# Patient Record
Sex: Female | Born: 1975 | Race: Black or African American | Hispanic: No | Marital: Single | Smoking: Current every day smoker
Health system: Southern US, Community
[De-identification: ages and names within clinical notes are randomized; demographics above are authoritative.]

## PROBLEM LIST (undated history)

## (undated) DIAGNOSIS — F329 Major depressive disorder, single episode, unspecified: Secondary | ICD-10-CM

## (undated) DIAGNOSIS — I10 Essential (primary) hypertension: Secondary | ICD-10-CM

## (undated) DIAGNOSIS — E119 Type 2 diabetes mellitus without complications: Secondary | ICD-10-CM

## (undated) DIAGNOSIS — R87629 Unspecified abnormal cytological findings in specimens from vagina: Secondary | ICD-10-CM

## (undated) DIAGNOSIS — J45909 Unspecified asthma, uncomplicated: Secondary | ICD-10-CM

## (undated) DIAGNOSIS — F32A Depression, unspecified: Secondary | ICD-10-CM

## (undated) DIAGNOSIS — M1990 Unspecified osteoarthritis, unspecified site: Secondary | ICD-10-CM

## (undated) HISTORY — PX: TUBAL LIGATION: SHX77

## (undated) HISTORY — DX: Unspecified asthma, uncomplicated: J45.909

## (undated) HISTORY — DX: Unspecified abnormal cytological findings in specimens from vagina: R87.629

## (undated) HISTORY — DX: Unspecified osteoarthritis, unspecified site: M19.90

---

## 2009-07-14 ENCOUNTER — Emergency Department (HOSPITAL_COMMUNITY): Admission: EM | Admit: 2009-07-14 | Discharge: 2009-07-14 | Payer: Self-pay | Admitting: Emergency Medicine

## 2009-08-15 ENCOUNTER — Emergency Department (HOSPITAL_COMMUNITY): Admission: EM | Admit: 2009-08-15 | Discharge: 2009-08-15 | Payer: Self-pay | Admitting: Emergency Medicine

## 2010-06-23 LAB — GC/CHLAMYDIA PROBE AMP, GENITAL
Chlamydia, DNA Probe: POSITIVE — AB
GC Probe Amp, Genital: NEGATIVE

## 2010-06-23 LAB — URINALYSIS, ROUTINE W REFLEX MICROSCOPIC
Glucose, UA: NEGATIVE mg/dL
Protein, ur: NEGATIVE mg/dL
Urobilinogen, UA: 0.2 mg/dL (ref 0.0–1.0)

## 2010-06-23 LAB — CBC
HCT: 38 % (ref 36.0–46.0)
MCHC: 33.3 g/dL (ref 30.0–36.0)
MCV: 93.5 fL (ref 78.0–100.0)
Platelets: 274 10*3/uL (ref 150–400)
RDW: 13.2 % (ref 11.5–15.5)

## 2010-06-23 LAB — WET PREP, GENITAL

## 2010-06-23 LAB — COMPREHENSIVE METABOLIC PANEL
Albumin: 3.7 g/dL (ref 3.5–5.2)
BUN: 7 mg/dL (ref 6–23)
Calcium: 9.2 mg/dL (ref 8.4–10.5)
Chloride: 109 mEq/L (ref 96–112)
Creatinine, Ser: 0.81 mg/dL (ref 0.4–1.2)
Total Bilirubin: 0.5 mg/dL (ref 0.3–1.2)
Total Protein: 7.3 g/dL (ref 6.0–8.3)

## 2010-06-23 LAB — DIFFERENTIAL
Basophils Absolute: 0 10*3/uL (ref 0.0–0.1)
Lymphocytes Relative: 23 % (ref 12–46)
Monocytes Absolute: 0.5 10*3/uL (ref 0.1–1.0)
Neutro Abs: 5.8 10*3/uL (ref 1.7–7.7)

## 2010-06-23 LAB — LIPASE, BLOOD: Lipase: 15 U/L (ref 11–59)

## 2010-06-23 LAB — URINE MICROSCOPIC-ADD ON

## 2010-06-24 LAB — COMPREHENSIVE METABOLIC PANEL
ALT: 49 U/L — ABNORMAL HIGH (ref 0–35)
Albumin: 3.9 g/dL (ref 3.5–5.2)
Alkaline Phosphatase: 104 U/L (ref 39–117)
BUN: 8 mg/dL (ref 6–23)
Potassium: 4 mEq/L (ref 3.5–5.1)
Sodium: 138 mEq/L (ref 135–145)
Total Protein: 7.6 g/dL (ref 6.0–8.3)

## 2010-06-24 LAB — POCT PREGNANCY, URINE: Preg Test, Ur: NEGATIVE

## 2010-06-24 LAB — DIFFERENTIAL
Basophils Relative: 1 % (ref 0–1)
Lymphs Abs: 4.2 10*3/uL — ABNORMAL HIGH (ref 0.7–4.0)
Monocytes Absolute: 0.6 10*3/uL (ref 0.1–1.0)
Monocytes Relative: 7 % (ref 3–12)
Neutro Abs: 4.5 10*3/uL (ref 1.7–7.7)

## 2010-06-24 LAB — POCT URINALYSIS DIP (DEVICE)
Protein, ur: NEGATIVE mg/dL
Urobilinogen, UA: 0.2 mg/dL (ref 0.0–1.0)

## 2010-06-24 LAB — CBC
Platelets: 278 10*3/uL (ref 150–400)
RDW: 13.7 % (ref 11.5–15.5)

## 2010-09-25 ENCOUNTER — Emergency Department (HOSPITAL_COMMUNITY)
Admission: EM | Admit: 2010-09-25 | Discharge: 2010-09-25 | Disposition: A | Discharge status: Home / Self Care | Payer: Medicaid Other | Attending: Emergency Medicine | Admitting: Emergency Medicine

## 2010-09-25 DIAGNOSIS — M79609 Pain in unspecified limb: Secondary | ICD-10-CM | POA: Insufficient documentation

## 2010-09-25 DIAGNOSIS — F329 Major depressive disorder, single episode, unspecified: Secondary | ICD-10-CM | POA: Insufficient documentation

## 2010-09-25 DIAGNOSIS — K1370 Unspecified lesions of oral mucosa: Secondary | ICD-10-CM | POA: Insufficient documentation

## 2010-09-25 DIAGNOSIS — F3289 Other specified depressive episodes: Secondary | ICD-10-CM | POA: Insufficient documentation

## 2010-09-25 DIAGNOSIS — E669 Obesity, unspecified: Secondary | ICD-10-CM | POA: Insufficient documentation

## 2010-09-25 DIAGNOSIS — L989 Disorder of the skin and subcutaneous tissue, unspecified: Secondary | ICD-10-CM | POA: Insufficient documentation

## 2010-09-25 DIAGNOSIS — L909 Atrophic disorder of skin, unspecified: Secondary | ICD-10-CM | POA: Insufficient documentation

## 2011-09-01 ENCOUNTER — Encounter (HOSPITAL_COMMUNITY): Payer: Self-pay | Admitting: *Deleted

## 2011-09-01 ENCOUNTER — Emergency Department (HOSPITAL_COMMUNITY): Payer: Medicaid Other

## 2011-09-01 ENCOUNTER — Emergency Department (HOSPITAL_COMMUNITY)
Admission: EM | Admit: 2011-09-01 | Discharge: 2011-09-01 | Disposition: A | Discharge status: Home / Self Care | Payer: Medicaid Other | Attending: Emergency Medicine | Admitting: Emergency Medicine

## 2011-09-01 DIAGNOSIS — M25519 Pain in unspecified shoulder: Secondary | ICD-10-CM | POA: Insufficient documentation

## 2011-09-01 DIAGNOSIS — M25511 Pain in right shoulder: Secondary | ICD-10-CM

## 2011-09-01 DIAGNOSIS — R0989 Other specified symptoms and signs involving the circulatory and respiratory systems: Secondary | ICD-10-CM | POA: Insufficient documentation

## 2011-09-01 DIAGNOSIS — R0609 Other forms of dyspnea: Secondary | ICD-10-CM | POA: Insufficient documentation

## 2011-09-01 MED ORDER — TRAMADOL HCL 50 MG PO TABS: 50.0000 mg | ORAL_TABLET | Freq: Four times a day (QID) | ORAL | Status: AC | PRN

## 2011-09-01 MED ORDER — OXYCODONE-ACETAMINOPHEN 5-325 MG PO TABS
1.0000 | ORAL_TABLET | Freq: Once | ORAL | Status: AC
  Administered 2011-09-01: 1 via ORAL
  Filled 2011-09-01: qty 1

## 2011-09-01 MED ORDER — ONDANSETRON 4 MG PO TBDP
4.0000 mg | ORAL_TABLET | Freq: Once | ORAL | Status: AC
  Administered 2011-09-01: 4 mg via ORAL
  Filled 2011-09-01: qty 1

## 2011-09-01 MED ORDER — NAPROXEN 375 MG PO TABS: 375.0000 mg | ORAL_TABLET | Freq: Two times a day (BID) | ORAL | Status: DC

## 2011-09-01 NOTE — ED Notes (Signed)
Discharged home with written and verbal instructions.  Left ambulatory steady gait.  3 bus tickets given for family to return home.

## 2011-09-01 NOTE — Discharge Instructions (Signed)
As we discussed, your shoulder x-ray was normal today. Your chest x-ray showed some congestion but no specific emergency cause for your symptoms. You should call Dr Janey Greaser office tomorrow for a follow-up appointment for further evaluation.     Dyspnea Shortness of breath (dyspnea) is the feeling of uneasy breathing. Dyspnea should be evaluated promptly. DIAGNOSIS  Many tests may be done to find why you are having shortness of breath. Tests may include:  A chest X-ray.   A lung function test.   Blood tests.   Recordings of the electrical activity of the heart (electrocardiogram).   Exercise testing.   Sound wave images of the heart (a cardiac echocardiogram).   A scan.  A cause for your shortness of breath may not be identified initially. In this case, it is important to have a follow-up exam with your caregiver. HOME CARE INSTRUCTIONS   Do not smoke. Smoking is a common cause of shortness of breath. Ask for help to stop smoking.   Avoid being around chemicals that may bother your breathing, such as paint fumes or dust.   Rest as needed. Slowly begin your usual activities.   If medications were prescribed, take them as directed for the full length of time directed. This includes oxygen and any inhaled medications, if prescribed.   It is very important that you follow up with your caregiver or other physician as directed. Waiting to do so or failure to follow up could result in worsening of your condition, possible disability, or death.   Be sure you understand what to do or who to call if your shortness of breath worsens.  SEEK MEDICAL CARE IF:   Your condition does not improve in the time expected.   You have a hard time doing your normal activities even with rest.   You have any side effects from or problems with medications prescribed.  SEEK IMMEDIATE MEDICAL CARE IF:   You feel your shortness of breath is getting worse.   You feel lightheaded, faint or develop a  cough not controlled with medications.   You start coughing up blood.   You get pain with breathing.   You get chest pain or pain in your arms, shoulders or belly (abdomen).   You have a fever.   You are unable to walk up stairs or exercise the way you normally can.  MAKE SURE YOU:   Understand these instructions.   Will watch your condition.   Will get help right away if you are not doing well or get worse.  Document Released: 04/29/2004 Document Revised: 12/02/2010 Document Reviewed: 08/07/2009 [facility] Patient Information 2012 [place], [place].        Shoulder Pain The shoulder is a ball and socket joint. The muscles and tendons (rotator cuff) are what keep the shoulder in its joint and stable. This collection of muscles and tendons holds in the head (ball) of the humerus (upper arm bone) in the fossa (cup) of the scapula (shoulder blade). Today no reason was found for your shoulder pain. Often pain in the shoulder may be treated conservatively with temporary immobilization. For example, holding the shoulder in one place using a sling for rest. Physical therapy may be needed if problems continue. HOME CARE INSTRUCTIONS   Apply ice to the sore area for 15 to 20 minutes, 3 to 4 times per day for the first 2 days. Put the ice in a plastic bag. Place a towel between the bag of ice and your skin.  If you have or were given a shoulder sling and straps, do not remove for as long as directed by your caregiver or until you see a caregiver for a follow-up examination. If you need to remove it to shower or bathe, move your arm as little as possible.   Sleep on several pillows at night to lessen swelling and pain.   Only take over-the-counter or prescription medicines for pain, discomfort, or fever as directed by your caregiver.   Keep any follow-up appointments in order to avoid any type of permanent shoulder disability or chronic pain problems.  SEEK MEDICAL CARE IF:   Pain in your  shoulder increases or new pain develops in your arm, hand, or fingers.   Your hand or fingers are colder than your other hand.   You do not obtain pain relief with the medications or your pain becomes worse.  SEEK IMMEDIATE MEDICAL CARE IF:   Your arm, hand, or fingers are numb or tingling.   Your arm, hand, or fingers are swollen, painful, or turn white or blue.   You develop chest pain or shortness of breath.  MAKE SURE YOU:   Understand these instructions.   Will watch your condition.   Will get help right away if you are not doing well or get worse.  Document Released: 12/30/2004 Document Revised: 03/11/2011 Document Reviewed: 03/06/2011 [facility] Patient Information 2012 [place], [place].

## 2011-09-01 NOTE — ED Notes (Signed)
To ED for eval of right shoulder pain. Denies injury. Able to move right arm but with pain. Pt states, also, that when she is walking up stairs she becomes sob. This has been for the past 3 months. Appears in nad at this time

## 2011-09-01 NOTE — ED Provider Notes (Signed)
History     CSN: [id number]  Arrival date & time 09/01/11  1522   First MD Initiated Contact with Patient 09/01/11 1849      Chief Complaint  Patient presents with  . Shoulder Pain    (Consider location/radiation/quality/duration/timing/severity/associated sxs/prior treatment) The history is provided by the patient.  36 y.o F presents to ED with c/c of right shoulder pain of unknown duration with NKI. Pain aching, severe, worse with movement and palpation of the area. Decreased ROM to the extremity. No assoc numbness or weakness. She does also endorse pain to bilateral hands, but this is chronic and unchanged. Taking ibuprofen without relief. No prior eval. Pt also with c/o SOB on exertion, with climbing stairs for the last several months, gradually worsening. No CP, cough, fever. No hx lung disease. Rest improves symptoms. Intermittent LE swelling, worse at night but not currently present. No recent weight gain. No prior treatment.  History reviewed. No pertinent past medical history.  Past Surgical History  Procedure Date  . Tubal ligation     History reviewed. No pertinent family history.  History  Substance Use Topics  . Smoking status: Current Everyday Smoker    Types: Cigarettes  . Smokeless tobacco: Not on file  . Alcohol Use: No     Review of Systems 10 systems reviewed and are negative for acute change except as noted in the HPI.  Allergies  Other  Home Medications   Current Outpatient Rx  Name Route Sig Dispense Refill  . ASPIRIN 325 MG PO TABS Oral Take 325 mg by mouth every 6 (six) hours as needed. For pain      BP 134/80  Pulse 84  Temp(Src) 98.3 F (36.8 C) (Oral)  Resp 18  SpO2 99%  Physical Exam  Nursing note and vitals reviewed. Constitutional: She appears well-developed and well-nourished.       Uncomfortable appearing.   HENT:  Head: Normocephalic and atraumatic.  Right Ear: External ear normal.  Left Ear: External ear normal.   MMM  Eyes: Conjunctivae are normal. Pupils are equal, round, and reactive to light.  Neck: Normal range of motion. Neck supple.  Cardiovascular: Normal rate, regular rhythm and normal heart sounds.   No murmur heard.      Bilateral radial and DP pulses are 2+   Pulmonary/Chest: Effort normal and breath sounds normal. She has no wheezes. She has no rales. She exhibits no tenderness.  Abdominal: Soft. Bowel sounds are normal. There is no tenderness.  Musculoskeletal:       Right shoulder: She exhibits decreased range of motion, tenderness (over AC joint, bicipital groove) and pain. She exhibits no swelling, no effusion, no crepitus, no deformity, no laceration, normal pulse and normal strength (isolated rotator cuff muscles 5/5, symmetric).       Right wrist: Normal.       Left wrist: Normal.       Right hand: Normal.       Left hand: Normal.       Calves supple and non-tender. No pretibial edema.  Neurological: She is alert.       Sensation intact to light touch and symmetric in extremities bilaterally. Normal gait.  Skin: Skin is warm and dry.    ED Course  Procedures (including critical care time)  Labs Reviewed - No data to display Dg Chest 2 View  09/01/2011  *RADIOLOGY REPORT*  Clinical Data: Right shoulder pain.  CHEST - 2 VIEW  Comparison: None.  Findings: The heart  size is normal.  Mild pulmonary vascular congestion is present.  No focal airspace disease is evident.  The visualized soft tissues and bony thorax are unremarkable.  IMPRESSION:  1.  Mild pulmonary vascular congestion without frank edema or failure. 2.  No focal airspace disease.  Original Report Authenticated By: Jamesetta Orleans. MATTERN, M.D.   Dg Shoulder Right  09/01/2011  *RADIOLOGY REPORT*  Clinical Data: Shoulder pain.  RIGHT SHOULDER - 2+ VIEW  Comparison: None.  Findings: The humerus is located and the acromioclavicular joint is intact. There is no fracture.  Imaged right lung and ribs appear normal.   IMPRESSION: Negative of exam.  Original Report Authenticated By: Bernadene Bell. D'ALESSIO, M.D.     1. Right shoulder pain   2. Exertional dyspnea       MDM  Unknown duration shoulder pain. Imaging reviewed, normal. Strength preserved. Distal NVI. Advised ortho f/u if continues.   SOB with exertion. Pt is obese, suspect partially due to deconditioning. CXR with mild vascular congestion, no frank edema or CHF. PulseOx when ambulating did not drop below 98%. No crackles on lung exam. No visibly increased WOB with ambulation. Pt agrees to f/u with PCP.        Shaaron Adler, [place] 09/01/11 2121

## 2011-09-01 NOTE — ED Notes (Signed)
Pain in shoulder and hands x several weeks.  Wakes her up at night with pain and tingling.

## 2011-09-01 NOTE — ED Notes (Signed)
Pulse Ox stayed between 98-99% while ambulating.

## 2011-09-02 NOTE — ED Provider Notes (Signed)
Medical screening examination/treatment/procedure(s) were performed by non-physician practitioner and as supervising physician I was immediately available for consultation/collaboration.  Maranda Marte L Meyer Dockery, MD 09/02/11 0900 

## 2012-02-07 ENCOUNTER — Emergency Department (HOSPITAL_COMMUNITY)
Admission: EM | Admit: 2012-02-07 | Discharge: 2012-02-07 | Disposition: A | Discharge status: Home / Self Care | Payer: Self-pay | Attending: Emergency Medicine | Admitting: Emergency Medicine

## 2012-02-07 ENCOUNTER — Encounter (HOSPITAL_COMMUNITY): Payer: Self-pay | Admitting: Emergency Medicine

## 2012-02-07 DIAGNOSIS — L0291 Cutaneous abscess, unspecified: Secondary | ICD-10-CM

## 2012-02-07 DIAGNOSIS — L03119 Cellulitis of unspecified part of limb: Secondary | ICD-10-CM | POA: Insufficient documentation

## 2012-02-07 DIAGNOSIS — L02419 Cutaneous abscess of limb, unspecified: Secondary | ICD-10-CM | POA: Insufficient documentation

## 2012-02-07 DIAGNOSIS — Y999 Unspecified external cause status: Secondary | ICD-10-CM | POA: Insufficient documentation

## 2012-02-07 DIAGNOSIS — Y929 Unspecified place or not applicable: Secondary | ICD-10-CM | POA: Insufficient documentation

## 2012-02-07 DIAGNOSIS — IMO0001 Reserved for inherently not codable concepts without codable children: Secondary | ICD-10-CM | POA: Insufficient documentation

## 2012-02-07 DIAGNOSIS — L0390 Cellulitis, unspecified: Secondary | ICD-10-CM

## 2012-02-07 DIAGNOSIS — F17200 Nicotine dependence, unspecified, uncomplicated: Secondary | ICD-10-CM | POA: Insufficient documentation

## 2012-02-07 MED ORDER — TETANUS-DIPHTH-ACELL PERTUSSIS 5-2.5-18.5 LF-MCG/0.5 IM SUSP
0.5000 mL | Freq: Once | INTRAMUSCULAR | Status: AC
  Administered 2012-02-07: 0.5 mL via INTRAMUSCULAR
  Filled 2012-02-07: qty 0.5

## 2012-02-07 MED ORDER — IBUPROFEN 400 MG PO TABS
600.0000 mg | ORAL_TABLET | Freq: Once | ORAL | Status: AC
  Administered 2012-02-07: 600 mg via ORAL
  Filled 2012-02-07: qty 1

## 2012-02-07 MED ORDER — DOXYCYCLINE HYCLATE 100 MG PO CAPS: 100.0000 mg | ORAL_CAPSULE | Freq: Two times a day (BID) | ORAL | Status: DC

## 2012-02-07 NOTE — ED Provider Notes (Addendum)
History  This chart was scribed for Suzi Roots, MD by Shari Heritage. The patient was seen in room TR05C/TR05C. Patient's care was started at 0830.     CSN: [id number]  Arrival date & time 02/07/12  0820   First MD Initiated Contact with Patient 02/07/12 0830      Chief Complaint  Patient presents with  . Insect Bite     The history is provided by the patient. No language interpreter was used.   HPI Comments: Nicole Mullen is a 36 y.o. female who presents to the Emergency Department complaining of a moderately, constantly painful sore to left lateral lower leg onset 3-4 days ago. Patient states that she may have been bitten by a bug, but she is not sure what caused the sore. She says that when she first noticed the area, it was itchy for several hours, then the itchy sensation is now resolved. Patient applied antibiotic ointment and gauze to the area at home. Patient denies fever. Patient is ambulatory. She denies history of diabetes. She is a current every day smoker. Tdap status is unknown.  Notes in past couple days, increased swelling/spreading redness around area. Denies hx mrsa. No fever or chills. No nv. No hx diabetes. Does not feel ill.     No past medical history on file.  Past Surgical History  Procedure Date  . Tubal ligation     No family history on file.  History  Substance Use Topics  . Smoking status: Current Every Day Smoker    Types: Cigarettes  . Smokeless tobacco: Not on file  . Alcohol Use: No    OB History    Grav Para Term Preterm Abortions TAB SAB Ect Mult Living                  Review of Systems  Constitutional: Negative for fever.  Skin: Positive for wound.  no numbness. No calf/leg swelling or pain. No red streaking.   Allergies  Other  Home Medications   Current Outpatient Rx  Name  Route  Sig  Dispense  Refill  . ASPIRIN 325 MG PO TABS   Oral   Take 325 mg by mouth every 6 (six) hours as needed. For pain         .  NAPROXEN 375 MG PO TABS   Oral   Take 1 tablet (375 mg total) by mouth 2 (two) times daily with a meal.   20 tablet   0     BP 131/87  Pulse 98  Temp 98.3 F (36.8 C) (Oral)  Resp 20  SpO2 96%  Physical Exam  Nursing note and vitals reviewed. Constitutional: She is oriented to person, place, and time. She appears well-developed and well-nourished. No distress.  HENT:  Head: Normocephalic and atraumatic.  Eyes: Conjunctivae normal are normal.  Cardiovascular: Normal rate.   Pulmonary/Chest: Effort normal.  Musculoskeletal: Normal range of motion. She exhibits no edema.       Pus filled blister approximately 1 cm diameter to lateral aspect left lower leg just proximal to ankle. Surrounding induration ?small area fluctuance centrally. Mild surrounding erythema. No lymphangitis. Distal pulses palp. No calf swelling, pain or tenderness.   Neurological: She is alert and oriented to person, place, and time.  Skin: Skin is warm and dry. No rash noted. She is not diaphoretic.    ED Course  Procedures (including critical care time) DIAGNOSTIC STUDIES: Oxygen Saturation is 96% on room air, adequate by my interpretation.  COORDINATION OF CARE: 8:35am- Patient informed of current plan for treatment and evaluation and agrees with plan at this time.       MDM  I personally performed the services described in this documentation, which was scribed in my presence. The recorded information has been reviewed and considered. Suzi Roots, MD   INCISION AND DRAINAGE Performed by: Suzi Roots Consent: Verbal consent obtained. Risks and benefits: risks, benefits and alternatives were discussed Type: abscess  Body area: left lower leg  Anesthesia: local infiltration  Local anesthetic: lidocaine 2% w epinephrine  Anesthetic total: 4 ml  Complexity: complex, scalpal used for I and D.  Blunt dissection to break up loculations  Drainage: purulent  Drainage amount: small amt  pus  Packing material: none, small elliptical area skin/dead skin excised  Patient tolerance: Patient tolerated the procedure well with no immediate complications.   Sterile dressing applied to area. Confirmed nkda.  Motrin po.  Given surrounding erythema, will give rx doxy.    Suzi Roots, MD 02/07/12 3086  Suzi Roots, MD 02/28/12 [phone]

## 2012-02-07 NOTE — ED Notes (Signed)
Pt has bug bite to left lower lateral leg since 4 days ago. Dime-sized bite. Pt applied antibiotic ointment covered site w/ gauze. Pt afebrile, A&Ox4, no edema noted to leg, ambulatory.

## 2012-06-29 ENCOUNTER — Encounter (HOSPITAL_COMMUNITY): Payer: Self-pay | Admitting: Radiology

## 2012-06-29 ENCOUNTER — Emergency Department (HOSPITAL_COMMUNITY): Payer: Self-pay

## 2012-06-29 ENCOUNTER — Emergency Department (HOSPITAL_COMMUNITY)
Admission: EM | Admit: 2012-06-29 | Discharge: 2012-06-29 | Disposition: A | Discharge status: Home / Self Care | Payer: Self-pay | Attending: Emergency Medicine | Admitting: Emergency Medicine

## 2012-06-29 DIAGNOSIS — Z3202 Encounter for pregnancy test, result negative: Secondary | ICD-10-CM | POA: Insufficient documentation

## 2012-06-29 DIAGNOSIS — F3289 Other specified depressive episodes: Secondary | ICD-10-CM | POA: Insufficient documentation

## 2012-06-29 DIAGNOSIS — R079 Chest pain, unspecified: Secondary | ICD-10-CM | POA: Insufficient documentation

## 2012-06-29 DIAGNOSIS — F17200 Nicotine dependence, unspecified, uncomplicated: Secondary | ICD-10-CM | POA: Insufficient documentation

## 2012-06-29 DIAGNOSIS — F329 Major depressive disorder, single episode, unspecified: Secondary | ICD-10-CM

## 2012-06-29 DIAGNOSIS — F411 Generalized anxiety disorder: Secondary | ICD-10-CM | POA: Insufficient documentation

## 2012-06-29 DIAGNOSIS — N390 Urinary tract infection, site not specified: Secondary | ICD-10-CM | POA: Insufficient documentation

## 2012-06-29 HISTORY — DX: Depression, unspecified: F32.A

## 2012-06-29 HISTORY — DX: Major depressive disorder, single episode, unspecified: F32.9

## 2012-06-29 LAB — COMPREHENSIVE METABOLIC PANEL
ALT: 19 U/L (ref 0–35)
AST: 26 U/L (ref 0–37)
Albumin: 3.9 g/dL (ref 3.5–5.2)
CO2: 26 mEq/L (ref 19–32)
Chloride: 103 mEq/L (ref 96–112)
GFR calc non Af Amer: >90 mL/min (ref >90)
Potassium: 3 mEq/L — ABNORMAL LOW (ref 3.5–5.1)
Total Bilirubin: 0.3 mg/dL (ref 0.3–1.2)

## 2012-06-29 LAB — CBC WITH DIFFERENTIAL/PLATELET
Basophils Relative: 1 % (ref 0–1)
Eosinophils Absolute: 0.1 10*3/uL (ref 0.0–0.7)
Eosinophils Relative: 1 % (ref 0–5)
Hemoglobin: 12.7 g/dL (ref 12.0–15.0)
Lymphs Abs: 3.5 10*3/uL (ref 0.7–4.0)
MCH: 29.7 pg (ref 26.0–34.0)
MCHC: 33.6 g/dL (ref 30.0–36.0)
MCV: 88.3 fL (ref 78.0–100.0)
Monocytes Absolute: 0.5 10*3/uL (ref 0.1–1.0)
Monocytes Relative: 6 % (ref 3–12)
RBC: 4.28 MIL/uL (ref 3.87–5.11)

## 2012-06-29 LAB — URINALYSIS, ROUTINE W REFLEX MICROSCOPIC
Bilirubin Urine: NEGATIVE
Glucose, UA: NEGATIVE mg/dL
Protein, ur: NEGATIVE mg/dL
Urobilinogen, UA: 0.2 mg/dL (ref 0.0–1.0)

## 2012-06-29 LAB — URINE MICROSCOPIC-ADD ON

## 2012-06-29 LAB — POCT PREGNANCY, URINE: Preg Test, Ur: NEGATIVE

## 2012-06-29 MED ORDER — POTASSIUM CHLORIDE CRYS ER 20 MEQ PO TBCR
60.0000 meq | EXTENDED_RELEASE_TABLET | Freq: Once | ORAL | Status: AC
  Administered 2012-06-29: 60 meq via ORAL
  Filled 2012-06-29: qty 3

## 2012-06-29 MED ORDER — SODIUM CHLORIDE 0.9 % IV BOLUS (SEPSIS)
1000.0000 mL | Freq: Once | INTRAVENOUS | Status: AC
  Administered 2012-06-29: 1000 mL via INTRAVENOUS

## 2012-06-29 MED ORDER — CEPHALEXIN 500 MG PO CAPS: 500.0000 mg | ORAL_CAPSULE | Freq: Two times a day (BID) | ORAL | Status: DC

## 2012-06-29 MED ORDER — KETOROLAC TROMETHAMINE 30 MG/ML IJ SOLN
30.0000 mg | Freq: Once | INTRAMUSCULAR | Status: AC
  Administered 2012-06-29: 30 mg via INTRAVENOUS
  Filled 2012-06-29: qty 9

## 2012-06-29 MED ORDER — HYDROMORPHONE HCL PF 1 MG/ML IJ SOLN
1.0000 mg | Freq: Once | INTRAMUSCULAR | Status: AC
  Administered 2012-06-29: 1 mg via INTRAVENOUS
  Filled 2012-06-29: qty 1

## 2012-06-29 MED ORDER — DESVENLAFAXINE SUCCINATE ER 50 MG PO TB24: 50.0000 mg | ORAL_TABLET | Freq: Every day | ORAL | Status: DC

## 2012-06-29 MED ORDER — CEPHALEXIN 250 MG PO CAPS
500.0000 mg | ORAL_CAPSULE | Freq: Once | ORAL | Status: AC
  Administered 2012-06-29: 500 mg via ORAL
  Filled 2012-06-29: qty 1

## 2012-06-29 MED ORDER — ONDANSETRON HCL 4 MG/2ML IJ SOLN
INTRAMUSCULAR | Status: AC
  Filled 2012-06-29: qty 2

## 2012-06-29 MED ORDER — ONDANSETRON HCL 4 MG/2ML IJ SOLN
4.0000 mg | Freq: Once | INTRAMUSCULAR | Status: AC
  Administered 2012-06-29: 4 mg via INTRAVENOUS

## 2012-06-29 NOTE — ED Notes (Signed)
MD at bedside. 

## 2012-06-29 NOTE — ED Notes (Signed)
Pt denies any N, V, radiation of pain, lightheadness, diaphoresis

## 2012-06-29 NOTE — ED Notes (Signed)
Pt presents with sudden onset of CP midsternal non radiation., pain with palpation. That pt describes as sharp. Pt received 324mg  of asa.

## 2012-06-29 NOTE — ED Provider Notes (Signed)
History     CSN: [id number]  Arrival date & time 06/29/12  0915   First MD Initiated Contact with Patient 06/29/12 [phone]      Chief Complaint  Patient presents with  . Chest Pain   HPI  This female with a history of pain in multiple areas for years, including chest pain now presents with worsening of her sternal chest pain.  She states that there is a stabbing sensation in her sternum.  This has been present for years, possibly decades.  The pain is become worse over the past few days, without clear precipitant, or any memorable changes in her education, diet, activity. Over, the patient has not taken any pain medication in an attempt to control the worsening pain. She notes the pain is incapacitating, but there is no significant associated dyspnea, abdominal pain, nausea, vomiting, lightheadedness, syncope. The patient also has a history of depression, for which she was previously taking Pristiq.  She stopped 1 week ago due to issues with her medical care access. Though she acknowledges increasing depression, anxiety over her pain, she denies suicidal ideation or homicidal ideation.  Past Medical History  Diagnosis Date  . Depression     Past Surgical History  Procedure Laterality Date  . Tubal ligation      History reviewed. No pertinent family history.  History  Substance Use Topics  . Smoking status: Current Every Day Smoker    Types: Cigarettes  . Smokeless tobacco: Not on file  . Alcohol Use: No    OB History   Grav Para Term Preterm Abortions TAB SAB Ect Mult Living                  Review of Systems  Psychiatric/Behavioral:       History of present illness    Allergies  Other  Home Medications  No current outpatient prescriptions on file.  Pulse 70  Temp(Src) 98.5 F (36.9 C) (Oral)  Resp 16  SpO2 100%  Physical Exam  Nursing note and vitals reviewed. Constitutional: She is oriented to person, place, and time. She appears well-developed and  well-nourished. No distress.  HENT:  Head: Normocephalic and atraumatic.  Eyes: Conjunctivae and EOM are normal.  Cardiovascular: Normal rate and regular rhythm.   Pulmonary/Chest: Effort normal and breath sounds normal. No stridor. No respiratory distress.    Abdominal: She exhibits no distension.  Musculoskeletal: She exhibits no edema.  Neurological: She is alert and oriented to person, place, and time. No cranial nerve deficit.  Skin: Skin is warm and dry.  Psychiatric: Her mood appears anxious. Thought content is not paranoid and not delusional. She expresses no homicidal and no suicidal ideation. She expresses no suicidal plans and no homicidal plans.  Tearful, but appropriately interactive.    ED Course  Procedures (including critical care time)  Labs Reviewed  CBC WITH DIFFERENTIAL  COMPREHENSIVE METABOLIC PANEL  URINALYSIS, ROUTINE W REFLEX MICROSCOPIC  TROPONIN I   No results found.   No diagnosis found.   Pulse ox 99% room air normal Cardiac 70 sinus rhythm normal    Date: 06/29/2012  Rate: 70  Rhythm: normal sinus rhythm  QRS Axis: normal  Intervals: normal  ST/T Wave abnormalities: nonspecific ST changes  Conduction Disutrbances:none  Narrative Interpretation:   Old EKG Reviewed: none available Repol, abnormal  12:32 PM Patient:.  We discussed the need for continued primary care physician, as well as someone to help her with her depression medication.  Additionally, the patient  was restarted on her Pristiq.  MDM  The patient presents with chest pain.  The patient has had similar pain for years, if not longer.  On exam patient is initially tearful, very uncomfortable with palpation of her.  The patient is hemodynamically stable, labs are largely reassuring.  The chronicity of her pain, and absence of acute findings today, there is little suspicion for acute coronary syndrome.  Given the patient's recent cessation of SSRI, there is some suspicion for this  as being to be toward to her emotional state.  Patient denies any suicidal or homicidal ideation, and after return of labs / studies, was appropriate for d/c. With borderline positive UA, ABX will be provided as well        Gerhard Munch, MD 06/29/12 1233

## 2012-07-18 ENCOUNTER — Emergency Department (HOSPITAL_COMMUNITY): Payer: Self-pay

## 2012-07-18 ENCOUNTER — Encounter (HOSPITAL_COMMUNITY): Payer: Self-pay | Admitting: Radiology

## 2012-07-18 ENCOUNTER — Emergency Department (HOSPITAL_COMMUNITY)
Admission: EM | Admit: 2012-07-18 | Discharge: 2012-07-18 | Disposition: A | Discharge status: Home / Self Care | Payer: Self-pay | Attending: Emergency Medicine | Admitting: Emergency Medicine

## 2012-07-18 DIAGNOSIS — J189 Pneumonia, unspecified organism: Secondary | ICD-10-CM | POA: Insufficient documentation

## 2012-07-18 DIAGNOSIS — F3289 Other specified depressive episodes: Secondary | ICD-10-CM | POA: Insufficient documentation

## 2012-07-18 DIAGNOSIS — Z79899 Other long term (current) drug therapy: Secondary | ICD-10-CM | POA: Insufficient documentation

## 2012-07-18 DIAGNOSIS — Z3202 Encounter for pregnancy test, result negative: Secondary | ICD-10-CM | POA: Insufficient documentation

## 2012-07-18 DIAGNOSIS — F17200 Nicotine dependence, unspecified, uncomplicated: Secondary | ICD-10-CM | POA: Insufficient documentation

## 2012-07-18 DIAGNOSIS — F329 Major depressive disorder, single episode, unspecified: Secondary | ICD-10-CM | POA: Insufficient documentation

## 2012-07-18 LAB — BASIC METABOLIC PANEL
BUN: 9 mg/dL (ref 6–23)
Chloride: 101 mEq/L (ref 96–112)
GFR calc Af Amer: >90 mL/min (ref >90)
GFR calc non Af Amer: >90 mL/min (ref >90)
Potassium: 3.2 mEq/L — ABNORMAL LOW (ref 3.5–5.1)
Sodium: 138 mEq/L (ref 135–145)

## 2012-07-18 LAB — CBC
HCT: 35.8 % — ABNORMAL LOW (ref 36.0–46.0)
Hemoglobin: 12.1 g/dL (ref 12.0–15.0)
MCHC: 33.8 g/dL (ref 30.0–36.0)
RDW: 13.8 % (ref 11.5–15.5)
WBC: 10.9 10*3/uL — ABNORMAL HIGH (ref 4.0–10.5)

## 2012-07-18 MED ORDER — KETOROLAC TROMETHAMINE 30 MG/ML IJ SOLN
30.0000 mg | Freq: Once | INTRAMUSCULAR | Status: AC
  Administered 2012-07-18: 30 mg via INTRAVENOUS
  Filled 2012-07-18: qty 1

## 2012-07-18 MED ORDER — AMOXICILLIN-POT CLAVULANATE 875-125 MG PO TABS: 1.0000 | ORAL_TABLET | Freq: Two times a day (BID) | ORAL | Status: DC

## 2012-07-18 MED ORDER — IBUPROFEN 600 MG PO TABS: 600.0000 mg | ORAL_TABLET | Freq: Four times a day (QID) | ORAL | Status: DC | PRN

## 2012-07-18 MED ORDER — GI COCKTAIL ~~LOC~~
30.0000 mL | Freq: Once | ORAL | Status: AC
  Administered 2012-07-18: 30 mL via ORAL
  Filled 2012-07-18: qty 30

## 2012-07-18 MED ORDER — IOHEXOL 350 MG/ML SOLN
100.0000 mL | Freq: Once | INTRAVENOUS | Status: AC | PRN
  Administered 2012-07-18: 100 mL via INTRAVENOUS

## 2012-07-18 NOTE — ED Provider Notes (Signed)
History     CSN: [id number]  Arrival date & time 07/18/12  0210   First MD Initiated Contact with Patient 07/18/12 0304      Chief Complaint  Patient presents with  . Chest Pain    (Consider location/radiation/quality/duration/timing/severity/associated sxs/prior treatment) Patient is a 37 y.o. female presenting with chest pain. The history is provided by the patient. No language interpreter was used.  Chest Pain Pain location:  Substernal area Pain quality: sharp   Pain radiates to:  Does not radiate Pain radiates to the back: no   Pain severity:  Severe Onset quality:  Gradual Timing:  Constant Progression:  Unchanged Chronicity:  Chronic Relieved by:  Nothing Worsened by:  Nothing tried Ineffective treatments:  None tried Associated symptoms: no abdominal pain, no cough and no palpitations   Risk factors: no aortic disease and no prior DVT/PE     Past Medical History  Diagnosis Date  . Depression     Past Surgical History  Procedure Laterality Date  . Tubal ligation      No family history on file.  History  Substance Use Topics  . Smoking status: Current Every Day Smoker    Types: Cigarettes  . Smokeless tobacco: Not on file  . Alcohol Use: No    OB History   Grav Para Term Preterm Abortions TAB SAB Ect Mult Living                  Review of Systems  Respiratory: Negative for cough.   Cardiovascular: Positive for chest pain. Negative for palpitations.  Gastrointestinal: Negative for abdominal pain.  All other systems reviewed and are negative.    Allergies  Other  Home Medications   Current Outpatient Rx  Name  Route  Sig  Dispense  Refill  . desvenlafaxine (PRISTIQ) 50 MG 24 hr tablet   Oral   Take 1 tablet (50 mg total) by mouth daily.   30 tablet   0     BP 128/72  Pulse 86  Temp(Src) 98.2 F (36.8 C) (Oral)  Resp 32  SpO2 96%  Physical Exam  Constitutional: She is oriented to person, place, and time. She appears  well-developed and well-nourished. No distress.  HENT:  Head: Normocephalic and atraumatic.  Mouth/Throat: Oropharynx is clear and moist.  Eyes: Conjunctivae are normal. Pupils are equal, round, and reactive to light.  Neck: Normal range of motion. Neck supple.  Cardiovascular: Normal rate, regular rhythm and intact distal pulses.   Pulmonary/Chest: Effort normal and breath sounds normal. She has no wheezes. She has no rales. She exhibits tenderness.  Abdominal: Soft. Bowel sounds are normal. There is no tenderness. There is no rebound and no guarding.  Musculoskeletal: Normal range of motion.  Neurological: She is alert and oriented to person, place, and time.  Skin: Skin is warm and dry.  Psychiatric: She has a normal mood and affect.    ED Course  Procedures (including critical care time)  Labs Reviewed  CBC - Abnormal; Notable for the following:    WBC 10.9 (*)    HCT 35.8 (*)    All other components within normal limits  BASIC METABOLIC PANEL - Abnormal; Notable for the following:    Potassium 3.2 (*)    Glucose, Bld 120 (*)    All other components within normal limits  D-DIMER, QUANTITATIVE - Abnormal; Notable for the following:    D-Dimer, Quant 0.67 (*)    All other components within normal limits  PRO  B NATRIURETIC PEPTIDE  POCT I-STAT TROPONIN I  POCT PREGNANCY, URINE   Dg Chest 2 View  07/18/2012  *RADIOLOGY REPORT*  Clinical Data: Mid chest pain for 3 hours.  CHEST - 2 VIEW  Comparison: Chest radiograph performed 06/29/2012  Findings: The lungs are well-aerated.  Mild right basilar and left perihilar airspace opacity may simply reflect transient prominence of vasculature, though mild pneumonia cannot be excluded depending on the patient's symptoms.  There is no evidence of pleural effusion or pneumothorax.  The heart is normal in size; the mediastinal contour is within normal limits.  No acute osseous abnormalities are seen.  IMPRESSION: Mild right basilar and left  perihilar airspace opacity may simply reflect transient prominence of vasculature, though mild pneumonia cannot be excluded depending on the patient's symptoms.   Original Report Authenticated By: Tonia Ghent, M.D.      No diagnosis found.    MDM   Date: 07/18/2012  Rate: 83  Rhythm: normal sinus rhythm  QRS Axis: normal  Intervals: normal  ST/T Wave abnormalities: nonspecific ST changes  Conduction Disutrbances:none  Narrative Interpretation:   Old EKG Reviewed: none available      Will treat for PNA and MSK chest pain.  ACS ruled out in the setting of ongoing pain and negative troponin    Doralee Kocak K Heinrich Fertig-Rasch, MD 07/18/12 [phone]

## 2012-07-18 NOTE — ED Notes (Signed)
Pt arrived from via [facility] c/o CP. A&O x4 sharp non radiating pain, with SOB. Pain exacerbated by palpation, and lying down. 20 ga left AC. 324mg   ASA administered prior to arrival

## 2012-07-18 NOTE — ED Notes (Signed)
Pt a x 4. Given bus pass for discharge. Verbalized understanding. No needs. Ambulatory at discharge. Denied any pain.

## 2012-10-11 ENCOUNTER — Emergency Department (HOSPITAL_COMMUNITY)
Admission: EM | Admit: 2012-10-11 | Discharge: 2012-10-11 | Disposition: A | Discharge status: Home / Self Care | Payer: Medicaid Other | Attending: Emergency Medicine | Admitting: Emergency Medicine

## 2012-10-11 ENCOUNTER — Encounter (HOSPITAL_COMMUNITY): Payer: Self-pay | Admitting: *Deleted

## 2012-10-11 DIAGNOSIS — F17200 Nicotine dependence, unspecified, uncomplicated: Secondary | ICD-10-CM | POA: Insufficient documentation

## 2012-10-11 DIAGNOSIS — F329 Major depressive disorder, single episode, unspecified: Secondary | ICD-10-CM | POA: Insufficient documentation

## 2012-10-11 DIAGNOSIS — R202 Paresthesia of skin: Secondary | ICD-10-CM

## 2012-10-11 DIAGNOSIS — Z79899 Other long term (current) drug therapy: Secondary | ICD-10-CM | POA: Insufficient documentation

## 2012-10-11 DIAGNOSIS — F3289 Other specified depressive episodes: Secondary | ICD-10-CM | POA: Insufficient documentation

## 2012-10-11 DIAGNOSIS — R209 Unspecified disturbances of skin sensation: Secondary | ICD-10-CM | POA: Insufficient documentation

## 2012-10-11 MED ORDER — NAPROXEN 500 MG PO TABS
500.0000 mg | ORAL_TABLET | Freq: Once | ORAL | Status: AC
  Administered 2012-10-11: 500 mg via ORAL
  Filled 2012-10-11: qty 1

## 2012-10-11 MED ORDER — NAPROXEN 500 MG PO TABS: 500.0000 mg | ORAL_TABLET | Freq: Two times a day (BID) | ORAL | Status: DC

## 2012-10-11 NOTE — ED Notes (Signed)
Pt states the past couple days her ring finger and pinkie finger on L hand were hurting and tingling, then today after a nap all her fingers on her L hand were hurting and both her wrist, denies injury.

## 2012-10-11 NOTE — ED Provider Notes (Signed)
History    This chart was scribed for non-physician practitioner Renne Crigler, PA-C, working with Sunnie Nielsen, MD by Donne Anon, ED Scribe. This patient was seen in room WTR5/WTR5 and the patient's care was started at 2200.  CSN: [id number] Arrival date & time 10/11/12  2154  First MD Initiated Contact with Patient 10/11/12 2200     Chief Complaint  Patient presents with  . Hand Pain    left    The history is provided by the patient. No language interpreter was used.   HPI Comments: Nicole Mullen is a 37 y.o. female who presents to the Emergency Department complaining of a few days of gradual onset, gradually worsening left 4th and 5th finger pain. She states that today her left 1st, 2nd and 3rd fingers began experiencing the same pain. She reports associated tinging. She denies recent trauma or injury. Making a fist makes the pain worse. She denies neck pain or any other pain. No slurred speech. No difficulty walking. No facial asymmetry. No involvement of other extremities. Pt denies taking OTC medications at home to improve symptoms. She states she is otherwise healthy.   Past Medical History  Diagnosis Date  . Depression    Past Surgical History  Procedure Laterality Date  . Tubal ligation     No family history on file. History  Substance Use Topics  . Smoking status: Current Every Day Smoker    Types: Cigarettes  . Smokeless tobacco: Never Used  . Alcohol Use: No   OB History   Grav Para Term Preterm Abortions TAB SAB Ect Mult Living                 Review of Systems  HENT: Negative for neck pain.   Musculoskeletal: Positive for arthralgias.  Neurological: Negative for numbness.  All other systems reviewed and are negative.    Allergies  Other  Home Medications   Current Outpatient Rx  Name  Route  Sig  Dispense  Refill  . amoxicillin-clavulanate (AUGMENTIN) 875-125 MG per tablet   Oral   Take 1 tablet by mouth 2 (two) times daily. One po bid x 10  days   20 tablet   0   . desvenlafaxine (PRISTIQ) 50 MG 24 hr tablet   Oral   Take 1 tablet (50 mg total) by mouth daily.   30 tablet   0   . ibuprofen (ADVIL,MOTRIN) 600 MG tablet   Oral   Take 1 tablet (600 mg total) by mouth every 6 (six) hours as needed for pain.   30 tablet   0    BP 142/72  Pulse 95  Temp(Src) 98.3 F (36.8 C) (Oral)  Resp 20  SpO2 98%  Physical Exam  Nursing note and vitals reviewed. Constitutional: She appears well-developed and well-nourished. No distress.  HENT:  Head: Normocephalic and atraumatic.  Eyes: Conjunctivae are normal.  Neck: Neck supple. No tracheal deviation present.  Cardiovascular: Normal rate and intact distal pulses.   Pulmonary/Chest: Effort normal. No respiratory distress.  Musculoskeletal: Normal range of motion.  Tender in L 4th and 5th digits with mildly decreased flexion of the digits. Normal strength. Pt reports decreased sensation in all fingers. Normal cap refill. Elbow, shoulder normal.   Neurological: She is alert. She has normal strength. A sensory deficit is present.  Skin: Skin is warm and dry.  Psychiatric: Her behavior is normal.  Flat affect    ED Course  Procedures (including critical care time) DIAGNOSTIC STUDIES:  Oxygen Saturation is 98% on RA, normal by my interpretation.    COORDINATION OF CARE: 10:10 PM Discussed treatment plan which includes antiinflammatories with pt at bedside and pt agreed to plan. Advised pt to seek follow up care. Discussed why I do not believe this was a CVA or warrants imaging.    Labs Reviewed - No data to display No results found. 1. Paresthesias    Patient urged to return with worsening symptoms or other concerns. Patient verbalized understanding and agrees with plan.    MDM  Paresthesias. Seems peripheral. Otherwise neurologically intact. Do not suspect stroke. Doubt cervical etiology. Doubt cubital tunnel, carpal tunnel. At this point, rest, NSAIDs indicated with  return/PCP f/u if worsening.   I personally performed the services described in this documentation, which was scribed in my presence. The recorded information has been reviewed and is accurate.    Renne Crigler, PA-C 10/12/12 [phone]

## 2012-10-12 NOTE — ED Provider Notes (Signed)
Medical screening examination/treatment/procedure(s) were performed by non-physician practitioner and as supervising physician I was immediately available for consultation/collaboration.  Lavaya Defreitas, MD 10/12/12 1712 

## 2013-02-15 ENCOUNTER — Encounter (HOSPITAL_COMMUNITY): Payer: Self-pay | Admitting: Emergency Medicine

## 2013-02-15 ENCOUNTER — Emergency Department (HOSPITAL_COMMUNITY)
Admission: EM | Admit: 2013-02-15 | Discharge: 2013-02-15 | Disposition: A | Discharge status: Home / Self Care | Payer: Medicaid Other | Attending: Emergency Medicine | Admitting: Emergency Medicine

## 2013-02-15 DIAGNOSIS — Z79899 Other long term (current) drug therapy: Secondary | ICD-10-CM | POA: Insufficient documentation

## 2013-02-15 DIAGNOSIS — N926 Irregular menstruation, unspecified: Secondary | ICD-10-CM | POA: Insufficient documentation

## 2013-02-15 DIAGNOSIS — F17200 Nicotine dependence, unspecified, uncomplicated: Secondary | ICD-10-CM | POA: Insufficient documentation

## 2013-02-15 DIAGNOSIS — Z3202 Encounter for pregnancy test, result negative: Secondary | ICD-10-CM | POA: Insufficient documentation

## 2013-02-15 DIAGNOSIS — Z9851 Tubal ligation status: Secondary | ICD-10-CM | POA: Insufficient documentation

## 2013-02-15 DIAGNOSIS — A499 Bacterial infection, unspecified: Secondary | ICD-10-CM | POA: Insufficient documentation

## 2013-02-15 DIAGNOSIS — F329 Major depressive disorder, single episode, unspecified: Secondary | ICD-10-CM | POA: Insufficient documentation

## 2013-02-15 DIAGNOSIS — F3289 Other specified depressive episodes: Secondary | ICD-10-CM | POA: Insufficient documentation

## 2013-02-15 DIAGNOSIS — B9689 Other specified bacterial agents as the cause of diseases classified elsewhere: Secondary | ICD-10-CM | POA: Insufficient documentation

## 2013-02-15 DIAGNOSIS — N760 Acute vaginitis: Secondary | ICD-10-CM | POA: Insufficient documentation

## 2013-02-15 LAB — CBC WITH DIFFERENTIAL/PLATELET
Basophils Absolute: 0 10*3/uL (ref 0.0–0.1)
Basophils Relative: 0 % (ref 0–1)
HCT: 38 % (ref 36.0–46.0)
MCHC: 33.4 g/dL (ref 30.0–36.0)
Monocytes Absolute: 0.6 10*3/uL (ref 0.1–1.0)
Neutro Abs: 3.1 10*3/uL (ref 1.7–7.7)
RDW: 13.6 % (ref 11.5–15.5)

## 2013-02-15 LAB — URINE MICROSCOPIC-ADD ON

## 2013-02-15 LAB — URINALYSIS, ROUTINE W REFLEX MICROSCOPIC
Bilirubin Urine: NEGATIVE
Glucose, UA: NEGATIVE mg/dL
Nitrite: NEGATIVE
Specific Gravity, Urine: 1.029 (ref 1.005–1.030)
pH: 5.5 (ref 5.0–8.0)

## 2013-02-15 LAB — WET PREP, GENITAL: Yeast Wet Prep HPF POC: NONE SEEN

## 2013-02-15 MED ORDER — METRONIDAZOLE 500 MG PO TABS: 500.0000 mg | ORAL_TABLET | Freq: Two times a day (BID) | ORAL | Status: DC

## 2013-02-15 NOTE — ED Provider Notes (Signed)
CSN: [id number]     Arrival date & time 02/15/13  1146 History   First MD Initiated Contact with Patient 02/15/13 1524     Chief Complaint  Patient presents with  . Vaginal Bleeding   (Consider location/radiation/quality/duration/timing/severity/associated sxs/prior Treatment) Patient is a 37 y.o. female presenting with vaginal bleeding. The history is provided by the patient and medical records.  Vaginal Bleeding  This is a a 37 y.o. F with PMH significant for depression presenting to the ED for irregular menstrual cycles.  Pt states for the past 2 months she has been having daily, heavy vaginal bleeding with clots intermixed.  States she has otherwise had irregular menstrual cycles, but usually they only last 3-4 days, never with clots. Patient states bleeding seems to stop when she applies baby powder to her genital region.  Pt is not currently on birth control.  Denies urinary sx or vaginal discharge, but states she has noticed a foul odor in her vaginal region.  Pt is s/p tubal ligation.  No new sexual partners or concern for STD at this time.  No known hx of endometriosis, fibroids, or ovarian cysts.  No prior hx of blood loss anemia or blood transfusion.  Denies any dizziness, palpitations, or syncopal events.  No abdominal pain, nausea, or vomiting.   Past Medical History  Diagnosis Date  . Depression    Past Surgical History  Procedure Laterality Date  . Tubal ligation     History reviewed. No pertinent family history. History  Substance Use Topics  . Smoking status: Current Every Day Smoker    Types: Cigarettes  . Smokeless tobacco: Never Used  . Alcohol Use: No   OB History   Grav Para Term Preterm Abortions TAB SAB Ect Mult Living                 Review of Systems  Genitourinary: Positive for menstrual problem.  All other systems reviewed and are negative.    Allergies  Other  Home Medications   Current Outpatient Rx  Name  Route  Sig  Dispense  Refill  .  desvenlafaxine (PRISTIQ) 50 MG 24 hr tablet   Oral   Take 1 tablet (50 mg total) by mouth daily.   30 tablet   0   . traZODone (DESYREL) 50 MG tablet   Oral   Take 25 mg by mouth at bedtime. Take 1/2 to 1 tablet          BP 136/79  Pulse 87  Temp(Src) 97.8 F (36.6 C) (Oral)  Resp 18  SpO2 98%  LMP 12/16/2012  Physical Exam  Nursing note and vitals reviewed. Constitutional: She is oriented to person, place, and time. She appears well-developed and well-nourished. No distress.  HENT:  Head: Normocephalic and atraumatic.  Mouth/Throat: Oropharynx is clear and moist.  Eyes: Conjunctivae and EOM are normal. Pupils are equal, round, and reactive to light.  Neck: Normal range of motion. Neck supple.  Cardiovascular: Normal rate, regular rhythm and normal heart sounds.   Pulmonary/Chest: Effort normal and breath sounds normal. No respiratory distress. She has no wheezes.  Abdominal: Soft. Bowel sounds are normal. There is no tenderness. There is no guarding.  Genitourinary: There is no lesion on the right labia. There is no lesion on the left labia. Cervix exhibits no motion tenderness. Right adnexum displays no tenderness. Left adnexum displays no tenderness. There is bleeding around the vagina.  Cervical os closed; scant vaginal bleeding with small clots intermixed; strong odor;  no visible purulent discharge; no adnexal or CMT  Musculoskeletal: Normal range of motion. She exhibits no edema.  Neurological: She is alert and oriented to person, place, and time.  Skin: Skin is warm and dry. She is not diaphoretic.  Psychiatric: She has a normal mood and affect.    ED Course  Procedures (including critical care time) Labs Review Labs Reviewed  WET PREP, GENITAL - Abnormal; Notable for the following:    Clue Cells Wet Prep HPF POC FEW (*)    WBC, Wet Prep HPF POC FEW (*)    All other components within normal limits  URINALYSIS, ROUTINE W REFLEX MICROSCOPIC - Abnormal; Notable for  the following:    Hgb urine dipstick LARGE (*)    Leukocytes, UA SMALL (*)    All other components within normal limits  CBC WITH DIFFERENTIAL - Abnormal; Notable for the following:    Neutrophils Relative % 34 (*)    Lymphocytes Relative 58 (*)    Lymphs Abs 5.4 (*)    All other components within normal limits  URINE MICROSCOPIC-ADD ON - Abnormal; Notable for the following:    Squamous Epithelial / LPF MANY (*)    All other components within normal limits  GC/CHLAMYDIA PROBE AMP  POCT PREGNANCY, URINE   Imaging Review No results found.  EKG Interpretation   None       MDM   1. Irregular menstrual bleeding   2. Bacterial vaginosis    Labs as above-- u/a with large blood, likely menstrual bleeding.  H/H stable.  Wet prep with few clue cells, however in the setting of foul vaginal odor will start on short course of flagyl.  Instructed to [facility] for further evaluation of her menstrual irregularities.  Discussed plan with pt, she agreed.  Return precautions advised.  Garlon Hatchet, PA-C 02/15/13 2141

## 2013-02-15 NOTE — ED Notes (Signed)
Pt reports irregular menstrual cycle and foul odor x 2 months. No acute distress noted at triage.

## 2013-02-15 NOTE — ED Notes (Signed)
PT comfortable with d/c and f/u instructions. Prescriptions x1 

## 2013-02-15 NOTE — ED Provider Notes (Signed)
Medical screening examination/treatment/procedure(s) were performed by non-physician practitioner and as supervising physician I was immediately available for consultation/collaboration.      Charles B. Bernette Mayers, MD 02/15/13 2222

## 2013-02-16 LAB — GC/CHLAMYDIA PROBE AMP: GC Probe RNA: NEGATIVE

## 2014-06-01 ENCOUNTER — Emergency Department (HOSPITAL_COMMUNITY)
Admission: EM | Admit: 2014-06-01 | Discharge: 2014-06-01 | Disposition: A | Discharge status: Home / Self Care | Payer: Medicaid Other | Attending: Emergency Medicine | Admitting: Emergency Medicine

## 2014-06-01 ENCOUNTER — Emergency Department (HOSPITAL_COMMUNITY): Payer: Medicaid Other

## 2014-06-01 ENCOUNTER — Encounter (HOSPITAL_COMMUNITY): Payer: Self-pay | Admitting: Emergency Medicine

## 2014-06-01 DIAGNOSIS — M79645 Pain in left finger(s): Secondary | ICD-10-CM | POA: Insufficient documentation

## 2014-06-01 DIAGNOSIS — J029 Acute pharyngitis, unspecified: Secondary | ICD-10-CM | POA: Insufficient documentation

## 2014-06-01 DIAGNOSIS — Z720 Tobacco use: Secondary | ICD-10-CM | POA: Insufficient documentation

## 2014-06-01 DIAGNOSIS — F329 Major depressive disorder, single episode, unspecified: Secondary | ICD-10-CM | POA: Insufficient documentation

## 2014-06-01 DIAGNOSIS — Z79899 Other long term (current) drug therapy: Secondary | ICD-10-CM | POA: Diagnosis not present

## 2014-06-01 LAB — RAPID STREP SCREEN (MED CTR MEBANE ONLY): STREPTOCOCCUS, GROUP A SCREEN (DIRECT): NEGATIVE

## 2014-06-01 MED ORDER — TRAMADOL HCL 50 MG PO TABS: 50.0000 mg | ORAL_TABLET | Freq: Four times a day (QID) | ORAL | Status: DC | PRN

## 2014-06-01 MED ORDER — OXYCODONE-ACETAMINOPHEN 5-325 MG PO TABS
2.0000 | ORAL_TABLET | Freq: Once | ORAL | Status: AC
  Administered 2014-06-01: 2 via ORAL
  Filled 2014-06-01: qty 2

## 2014-06-01 NOTE — ED Provider Notes (Signed)
CSN: [id number]     Arrival date & time 06/01/14  1414 History  This chart was scribed for Lucien Mons, PA-C working with Charlesetta Shanks, MD by Mercy Moore, ED Scribe. This patient was seen in room TR09C/TR09C and the patient's care was started at 2:45 PM.   Chief Complaint  Patient presents with  . Sore Throat  . Hand Pain   The history is provided by the patient. No language interpreter was used.   HPI Comments: Nicole Mullen is a 39 y.o. female who presents to the Emergency Department complaining of sore throat, for two days. Patient denies fever, cough, or any additional symptoms.   Patient secondarily complains of left thumb pain, ongoing for two weeks. Patient reports pain with movement and even gentle caress and/or applied pressure. Patient denies known injury. Patient denies redness or swelling. Patient is left hand dominant. Patient reports treatment with Aleve and Tylenol, but denies any relief.  Past Medical History  Diagnosis Date  . Depression    Past Surgical History  Procedure Laterality Date  . Tubal ligation     No family history on file. History  Substance Use Topics  . Smoking status: Current Every Day Smoker    Types: Cigarettes  . Smokeless tobacco: Never Used  . Alcohol Use: No   OB History    No data available     Review of Systems  Constitutional: Negative for fever and chills.  HENT: Positive for sore throat. Negative for congestion.   Respiratory: Negative for cough.   Musculoskeletal: Positive for arthralgias. Negative for joint swelling.  All other systems reviewed and are negative.  Allergies  Other  Home Medications   Prior to Admission medications   Medication Sig Start Date End Date Taking? Authorizing Provider  desvenlafaxine (PRISTIQ) 50 MG 24 hr tablet Take 1 tablet (50 mg total) by mouth daily. 06/29/12   Carmin Muskrat, MD  metroNIDAZOLE (FLAGYL) 500 MG tablet Take 1 tablet (500 mg total) by mouth 2 (two) times daily. 02/15/13    Larene Pickett, PA-C  traMADol (ULTRAM) 50 MG tablet Take 1 tablet (50 mg total) by mouth every 6 (six) hours as needed. 06/01/14   Carman Ching, PA-C  traZODone (DESYREL) 50 MG tablet Take 25 mg by mouth at bedtime. Take 1/2 to 1 tablet    Historical Provider, MD   Triage Vitals: 133/79 mmHg  Pulse 86  Temp(Src) 98.4 F (36.9 C)  Resp 18  SpO2 93%  LMP 12/16/2012 Physical Exam  Constitutional: She is oriented to person, place, and time. She appears well-developed and well-nourished. No distress.  HENT:  Head: Normocephalic and atraumatic.  Oropharynx is erythematous without edema or exudate. Uvula midline.  Eyes: Conjunctivae and EOM are normal.  Neck: Normal range of motion. Neck supple.  Cardiovascular: Normal rate, regular rhythm and normal heart sounds.   Pulmonary/Chest: Effort normal and breath sounds normal. No respiratory distress.  Musculoskeletal: She exhibits no edema.  L thumb TTP from distal tip to MCP. No swelling, deformity, erythema or warmth. ROM limited by pain. Wrist normal.  Lymphadenopathy:    She has no cervical adenopathy.  Neurological: She is alert and oriented to person, place, and time. No sensory deficit.  Skin: Skin is warm and dry.  Psychiatric: She has a normal mood and affect. Her behavior is normal.  Nursing note and vitals reviewed.   ED Course  Procedures (including critical care time)  COORDINATION OF CARE: 2:51 PM- Plans to obtain imaging of  left thumb and rapid strep. Discussed treatment plan with patient at bedside and patient agreed to plan.   Labs Review Labs Reviewed  RAPID STREP SCREEN  CULTURE, GROUP A STREP    Imaging Review Dg Finger Thumb Left  06/01/2014   CLINICAL DATA:  Left thumb pain x2 weeks  EXAM: LEFT THUMB 2+V  COMPARISON:  None.  FINDINGS: No fracture or dislocation is seen.  The joint spaces are preserved.  The visualized soft tissues are unremarkable.  IMPRESSION: No fracture or dislocation is seen.    Electronically Signed   By: Julian Hy M.D.   On: 06/01/2014 15:57     EKG Interpretation None      MDM   Final diagnoses:  Pharyngitis  Thumb pain, left   NAD. AFVSS. Rapid strep negative. Swallow secretions well. Discussed symptom medic treatment. Regarding thumb, symptoms improved after receiving Percocet, however still tender. No swelling or deformity concerning for infection or gout. X-ray without acute finding. Possibly tendinitis. Velcro thumb spica splint applied. Will give an Rx for tramadol. Advised RICE, NSAIDS. Follow-up with PCP. Stable for discharge. Return precautions given. Patient states understanding of treatment care plan and is agreeable.  I personally performed the services described in this documentation, which was scribed in my presence. The recorded information has been reviewed and is accurate.  Carman Ching, PA-C 06/01/14 1622  Charlesetta Shanks, MD 06/06/14 [phone]

## 2014-06-01 NOTE — ED Notes (Signed)
Pt. Stated, I've had a sore throat for 2 days and left thumb pain for a week.

## 2014-06-01 NOTE — ED Notes (Signed)
Declined W/C at D/C and was escorted to lobby by RN. 

## 2014-06-01 NOTE — Progress Notes (Signed)
Orthopedic Tech Progress Note Patient Details:  Nicole Mullen 03-04-1976 [id number] Applied Velcro thumb spica splint to LUE.  Pulses, sensation, motion intact before and after splinting.  Capillary refill less than 2 seconds before and after splinting. Ortho Devices Type of Ortho Device: Thumb spica splint Splint Material: Other (comment) Ortho Device/Splint Location: LUE Ortho Device/Splint Interventions: Application   Darrol Poke 06/01/2014, 4:36 PM

## 2014-06-01 NOTE — Discharge Instructions (Signed)
Take tramadol as directed as needed for pain. Use the splint for comfort. Follow-up with your primary care physician. Ice and elevate your hand. Use salt water gargles for your sore throat. Rest and stay well-hydrated.  Musculoskeletal Pain Musculoskeletal pain is muscle and boney aches and pains. These pains can occur in any part of the body. Your caregiver may treat you without knowing the cause of the pain. They may treat you if blood or urine tests, X-rays, and other tests were normal.  CAUSES There is often not a definite cause or reason for these pains. These pains may be caused by a type of germ (virus). The discomfort may also come from overuse. Overuse includes working out too hard when your body is not fit. Boney aches also come from weather changes. Bone is sensitive to atmospheric pressure changes. HOME CARE INSTRUCTIONS   Ask when your test results will be ready. Make sure you get your test results.  Only take over-the-counter or prescription medicines for pain, discomfort, or fever as directed by your caregiver. If you were given medications for your condition, do not drive, operate machinery or power tools, or sign legal documents for 24 hours. Do not drink alcohol. Do not take sleeping pills or other medications that may interfere with treatment.  Continue all activities unless the activities cause more pain. When the pain lessens, slowly resume normal activities. Gradually increase the intensity and duration of the activities or exercise.  During periods of severe pain, bed rest may be helpful. Lay or sit in any position that is comfortable.  Putting ice on the injured area.  Put ice in a bag.  Place a towel between your skin and the bag.  Leave the ice on for 15 to 20 minutes, 3 to 4 times a day.  Follow up with your caregiver for continued problems and no reason can be found for the pain. If the pain becomes worse or does not go away, it may be necessary to repeat tests or  do additional testing. Your caregiver may need to look further for a possible cause. SEEK IMMEDIATE MEDICAL CARE IF:  You have pain that is getting worse and is not relieved by medications.  You develop chest pain that is associated with shortness or breath, sweating, feeling sick to your stomach (nauseous), or throw up (vomit).  Your pain becomes localized to the abdomen.  You develop any new symptoms that seem different or that concern you. MAKE SURE YOU:   Understand these instructions.  Will watch your condition.  Will get help right away if you are not doing well or get worse. Document Released: 03/22/2005 Document Revised: 06/14/2011 Document Reviewed: 11/24/2012 [facility] Patient Information 2015 [place], [place]. This information is not intended to replace advice given to you by your health care provider. Make sure you discuss any questions you have with your health care provider. Pharyngitis Pharyngitis is redness, pain, and swelling (inflammation) of your pharynx.  CAUSES  Pharyngitis is usually caused by infection. Most of the time, these infections are from viruses (viral) and are part of a cold. However, sometimes pharyngitis is caused by bacteria (bacterial). Pharyngitis can also be caused by allergies. Viral pharyngitis may be spread from person to person by coughing, sneezing, and personal items or utensils (cups, forks, spoons, toothbrushes). Bacterial pharyngitis may be spread from person to person by more intimate contact, such as kissing.  SIGNS AND SYMPTOMS  Symptoms of pharyngitis include:   Sore throat.   Tiredness (fatigue).  Low-grade fever.   Headache.  Joint pain and muscle aches.  Skin rashes.  Swollen lymph nodes.  Plaque-like film on throat or tonsils (often seen with bacterial pharyngitis). DIAGNOSIS  Your health care provider will ask you questions about your illness and your symptoms. Your medical history, along with a physical exam, is  often all that is needed to diagnose pharyngitis. Sometimes, a rapid strep test is done. Other lab tests may also be done, depending on the suspected cause.  TREATMENT  Viral pharyngitis will usually get better in 3-4 days without the use of medicine. Bacterial pharyngitis is treated with medicines that kill germs (antibiotics).  HOME CARE INSTRUCTIONS   Drink enough water and fluids to keep your urine clear or pale yellow.   Only take over-the-counter or prescription medicines as directed by your health care provider:   If you are prescribed antibiotics, make sure you finish them even if you start to feel better.   Do not take aspirin.   Get lots of rest.   Gargle with 8 oz of salt water ( tsp of salt per 1 qt of water) as often as every 1-2 hours to soothe your throat.   Throat lozenges (if you are not at risk for choking) or sprays may be used to soothe your throat. SEEK MEDICAL CARE IF:   You have large, tender lumps in your neck.  You have a rash.  You cough up green, yellow-brown, or bloody spit. SEEK IMMEDIATE MEDICAL CARE IF:   Your neck becomes stiff.  You drool or are unable to swallow liquids.  You vomit or are unable to keep medicines or liquids down.  You have severe pain that does not go away with the use of recommended medicines.  You have trouble breathing (not caused by a stuffy nose). MAKE SURE YOU:   Understand these instructions.  Will watch your condition.  Will get help right away if you are not doing well or get worse. Document Released: 03/22/2005 Document Revised: 01/10/2013 Document Reviewed: 11/27/2012 [facility] Patient Information 2015 [place], [place]. This information is not intended to replace advice given to you by your health care provider. Make sure you discuss any questions you have with your health care provider. Tendinitis Tendinitis is swelling and inflammation of the tendons. Tendons are band-like tissues that connect muscle to  bone. Tendinitis commonly occurs in the:   Shoulders (rotator cuff).  Heels (Achilles tendon).  Elbows (triceps tendon). CAUSES Tendinitis is usually caused by overusing the tendon, muscles, and joints involved. When the tissue surrounding a tendon (synovium) becomes inflamed, it is called tenosynovitis. Tendinitis commonly develops in people whose jobs require repetitive motions. SYMPTOMS  Pain.  Tenderness.  Mild swelling. DIAGNOSIS Tendinitis is usually diagnosed by physical exam. Your health care provider may also order X-rays or other imaging tests. TREATMENT Your health care provider may recommend certain medicines or exercises for your treatment. HOME CARE INSTRUCTIONS   Use a sling or splint for as long as directed by your health care provider until the pain decreases.  Put ice on the injured area.  Put ice in a plastic bag.  Place a towel between your skin and the bag.  Leave the ice on for 15-20 minutes, 3-4 times a day, or as directed by your health care provider.  Avoid using the limb while the tendon is painful. Perform gentle range of motion exercises only as directed by your health care provider. Stop exercises if pain or discomfort increase, unless directed otherwise  by your health care provider.  Only take over-the-counter or prescription medicines for pain, discomfort, or fever as directed by your health care provider. SEEK MEDICAL CARE IF:   Your pain and swelling increase.  You develop new, unexplained symptoms, especially increased numbness in the hands. MAKE SURE YOU:   Understand these instructions.  Will watch your condition.  Will get help right away if you are not doing well or get worse. Document Released: 03/19/2000 Document Revised: 08/06/2013 Document Reviewed: 06/08/2010 [facility] Patient Information 2015 [place], [place]. This information is not intended to replace advice given to you by your health care provider. Make sure you discuss any  questions you have with your health care provider.

## 2014-06-02 IMAGING — CT CT ANGIO CHEST
1 of 3 series · 19 of 30 positions shown · IV contrast (APPLIED)
Comparison: Chest radiograph performed earlier today at [DATE] a.m.

CLINICAL DATA: Chest pain.

CT ANGIOGRAPHY CHEST
TECHNIQUE: Multidetector CT imaging of the chest using the
standard protocol during bolus administration of intravenous
contrast. Multiplanar reconstructed images including MIPs were
obtained and reviewed to evaluate the vascular anatomy.
Contrast: 100mL OMNIPAQUE IOHEXOL 350 MG/ML SOLN

[Series 6: pulm embolism 1.0 b25f thins · axial · 0.78mm/px · z∈[-286,-48]mm · 19 of 267 slices shown]
[im 14/267  lung]
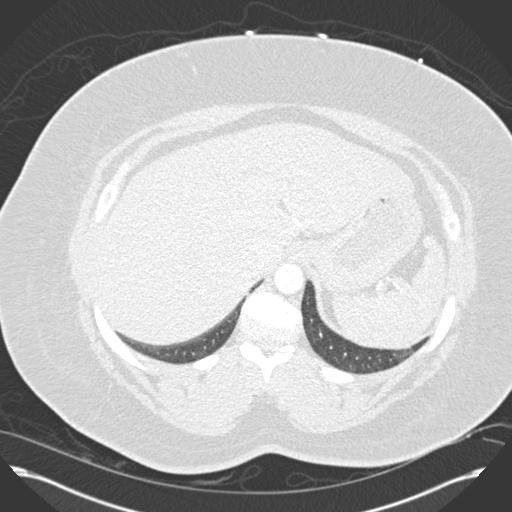
[im 27/267  mediastinal]
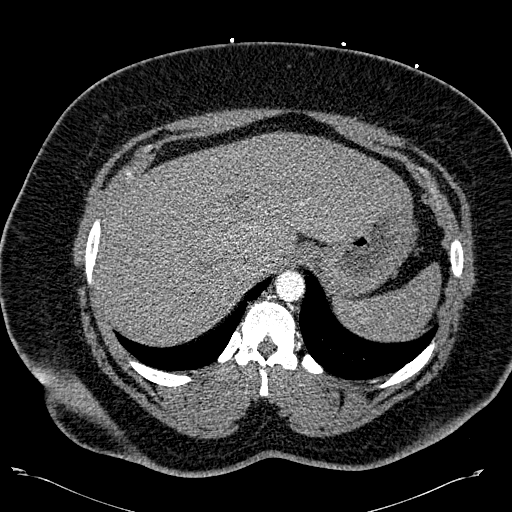
[im 40/267  lung]
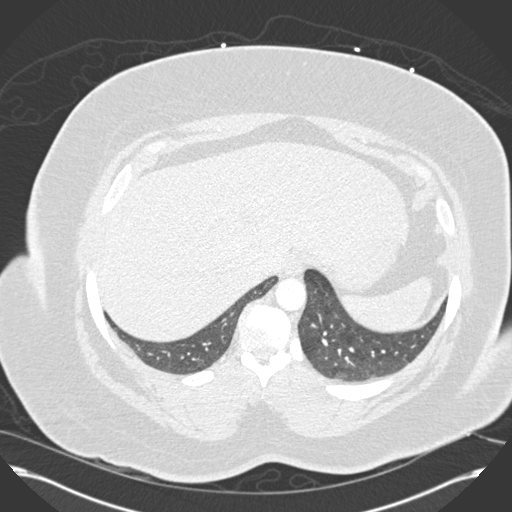
[im 54/267  mediastinal]
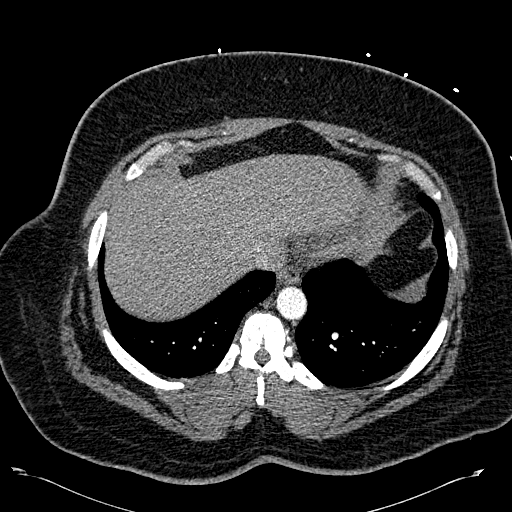
[im 67/267  lung]
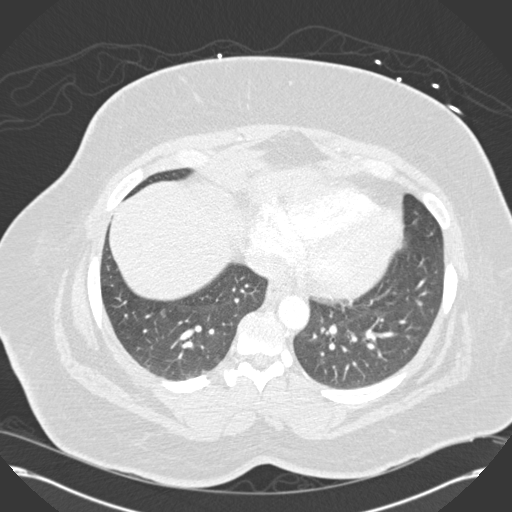
[im 80/267  mediastinal]
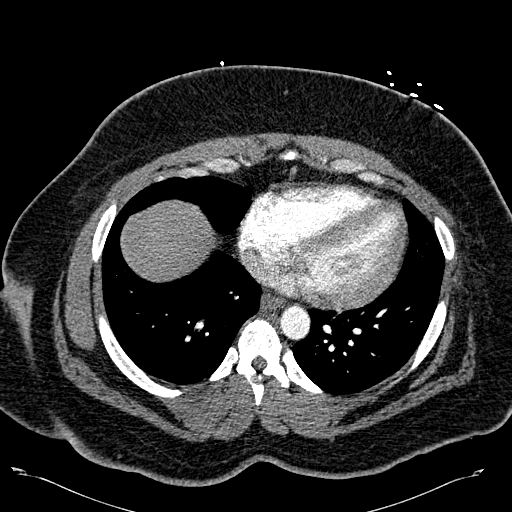
[im 94/267  lung]
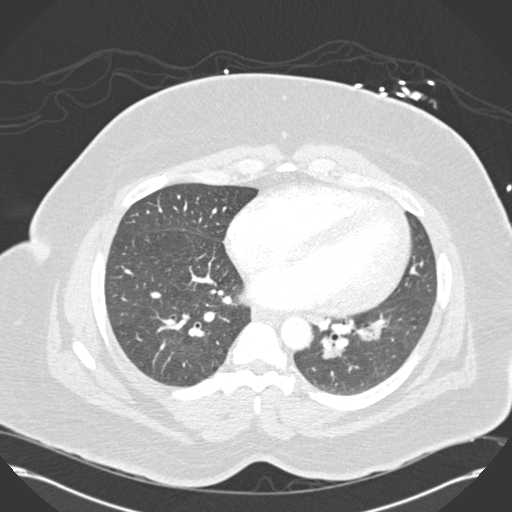
[im 107/267  mediastinal]
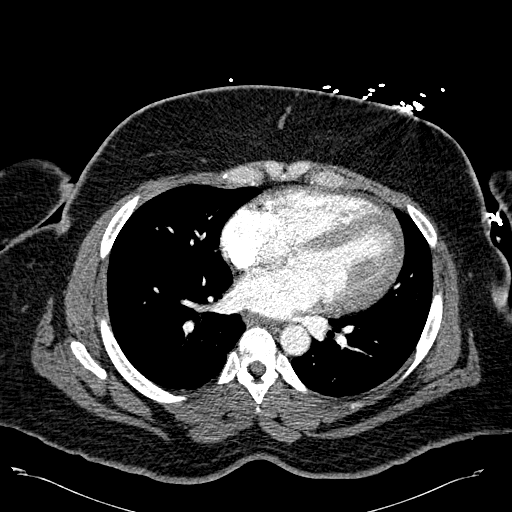
[im 120/267  lung]
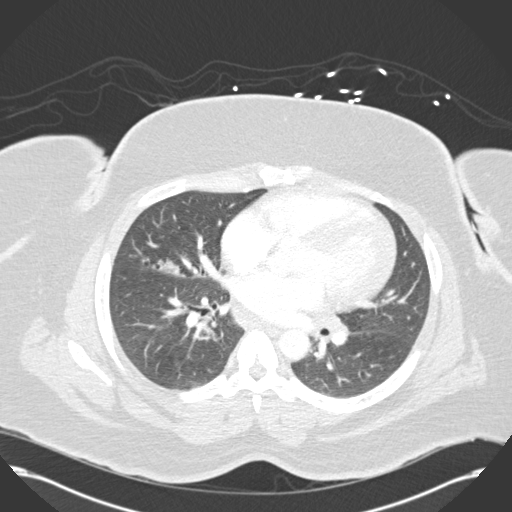
[im 134/267  mediastinal]
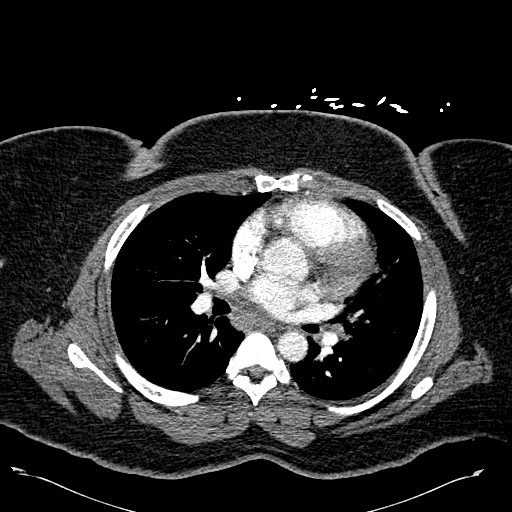
[im 147/267  lung]
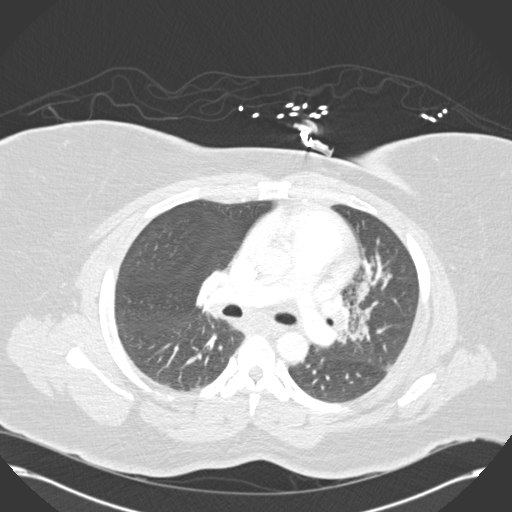
[im 160/267  mediastinal]
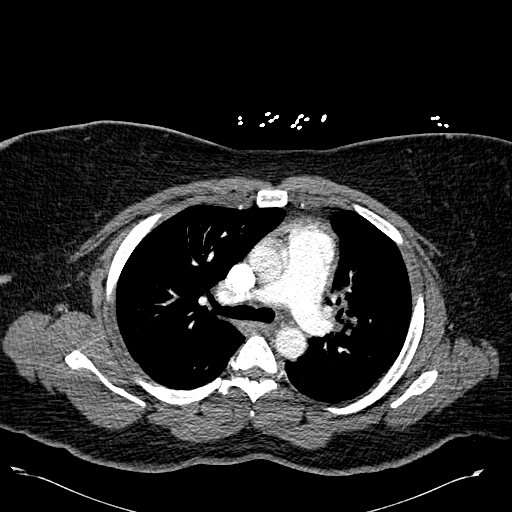
[im 173/267  lung]
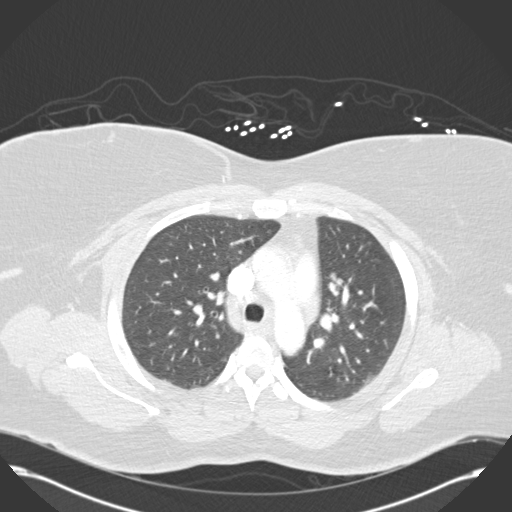
[im 187/267  mediastinal]
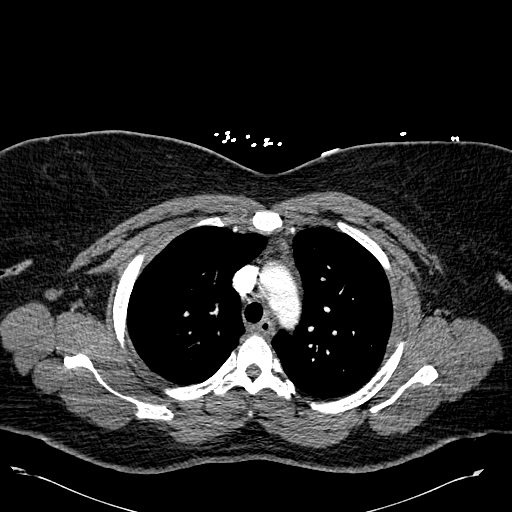
[im 200/267  lung]
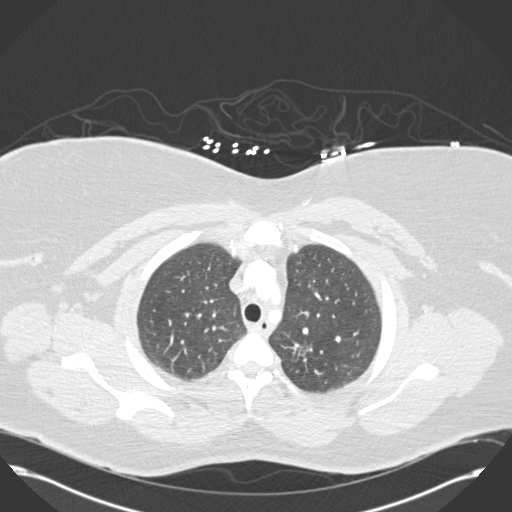
[im 213/267  mediastinal]
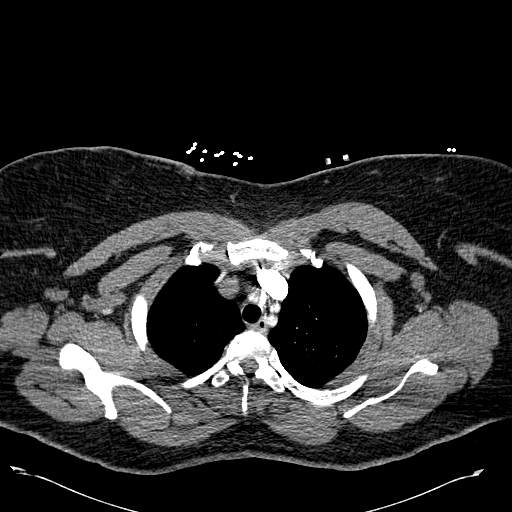
[im 227/267  lung]
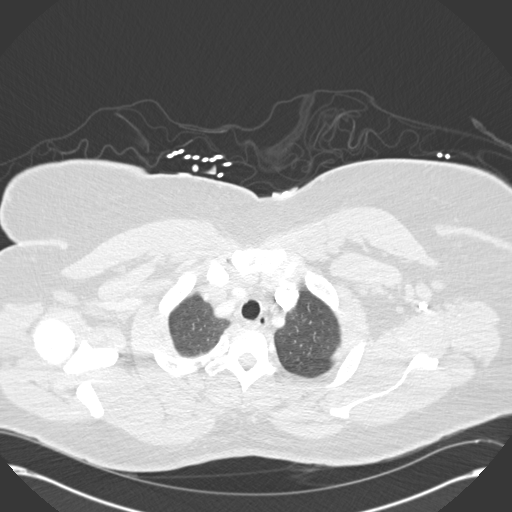
[im 240/267  mediastinal]
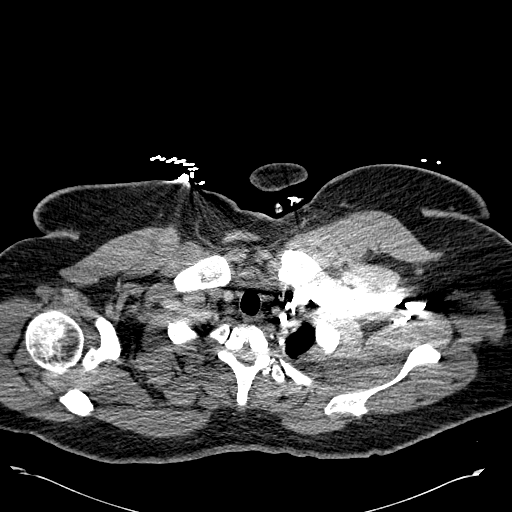
[im 253/267  lung]
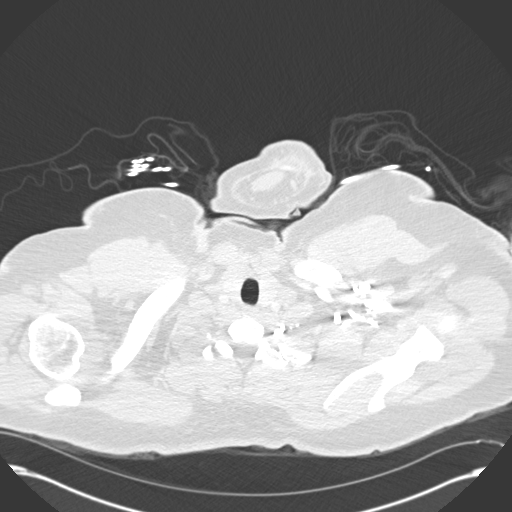

[19 of 30 positions shown; findings below may reference images not displayed]

FINDINGS: There is no evidence of pulmonary embolus.

Mild patchy bilateral airspace opacities are noted, relatively
central in appearance.  These are most prominent at the medial left
lingula.  Findings are compatible with mild multifocal pneumonia.
Minimal nodular opacities in the periphery of both lungs are also
thought to reflect the acute infectious process.  There is no
evidence of pleural effusion or pneumothorax.  No masses are
identified; no abnormal focal contrast enhancement is seen.

An enlarged subcarinal node is seen, measuring 1.5 cm in short
axis.  There is also an enlarged right hilar node, measuring 1.3 cm
in short axis.  Remaining visualized nodes remain grossly normal in
size.  No pericardial effusion is seen.  The great vessels are
grossly unremarkable in appearance.  No axillary lymphadenopathy is
seen.  The visualized portions of the thyroid gland are
unremarkable in appearance.

The visualized portions of the liver and spleen are unremarkable.

No acute osseous abnormalities are seen.
IMPRESSION: 1.  No evidence of pulmonary embolus.
2.  Mild patchy bilateral airspace opacities, relatively central in
appearance.  Findings compatible with mild multifocal pneumonia.
2.  Enlarged subcarinal and right hilar nodes, measuring 1.5 cm and
1.3 cm in short axis.  These may reflect the acute infectious
process.

## 2014-06-03 LAB — CULTURE, GROUP A STREP: Strep A Culture: NEGATIVE

## 2014-11-12 ENCOUNTER — Emergency Department (HOSPITAL_COMMUNITY)
Admission: EM | Admit: 2014-11-12 | Discharge: 2014-11-13 | Disposition: A | Discharge status: Home / Self Care | Payer: Medicaid Other | Attending: Emergency Medicine | Admitting: Emergency Medicine

## 2014-11-12 ENCOUNTER — Encounter (HOSPITAL_COMMUNITY): Payer: Self-pay | Admitting: *Deleted

## 2014-11-12 DIAGNOSIS — M79645 Pain in left finger(s): Secondary | ICD-10-CM | POA: Diagnosis not present

## 2014-11-12 DIAGNOSIS — G8929 Other chronic pain: Secondary | ICD-10-CM | POA: Insufficient documentation

## 2014-11-12 DIAGNOSIS — Z720 Tobacco use: Secondary | ICD-10-CM | POA: Insufficient documentation

## 2014-11-12 DIAGNOSIS — R600 Localized edema: Secondary | ICD-10-CM | POA: Insufficient documentation

## 2014-11-12 DIAGNOSIS — M79646 Pain in unspecified finger(s): Secondary | ICD-10-CM

## 2014-11-12 DIAGNOSIS — Z3202 Encounter for pregnancy test, result negative: Secondary | ICD-10-CM | POA: Insufficient documentation

## 2014-11-12 DIAGNOSIS — M79644 Pain in right finger(s): Secondary | ICD-10-CM | POA: Diagnosis not present

## 2014-11-12 DIAGNOSIS — F329 Major depressive disorder, single episode, unspecified: Secondary | ICD-10-CM | POA: Insufficient documentation

## 2014-11-12 DIAGNOSIS — Z79899 Other long term (current) drug therapy: Secondary | ICD-10-CM | POA: Insufficient documentation

## 2014-11-12 DIAGNOSIS — R609 Edema, unspecified: Secondary | ICD-10-CM

## 2014-11-12 LAB — COMPREHENSIVE METABOLIC PANEL
ALT: 22 U/L (ref 14–54)
AST: 32 U/L (ref 15–41)
Albumin: 3.7 g/dL (ref 3.5–5.0)
Alkaline Phosphatase: 89 U/L (ref 38–126)
Anion gap: 9 (ref 5–15)
BILIRUBIN TOTAL: 0.3 mg/dL (ref 0.3–1.2)
BUN: 9 mg/dL (ref 6–20)
CALCIUM: 9.8 mg/dL (ref 8.9–10.3)
CO2: 27 mmol/L (ref 22–32)
Chloride: 107 mmol/L (ref 101–111)
Creatinine, Ser: 1.02 mg/dL — ABNORMAL HIGH (ref 0.44–1.00)
Glucose, Bld: 89 mg/dL (ref 65–99)
Potassium: 4.4 mmol/L (ref 3.5–5.1)
Sodium: 143 mmol/L (ref 135–145)
Total Protein: 6.6 g/dL (ref 6.5–8.1)

## 2014-11-12 LAB — URINALYSIS, ROUTINE W REFLEX MICROSCOPIC
Bilirubin Urine: NEGATIVE
Glucose, UA: NEGATIVE mg/dL
Hgb urine dipstick: NEGATIVE
Ketones, ur: NEGATIVE mg/dL
Nitrite: NEGATIVE
Protein, ur: NEGATIVE mg/dL
Specific Gravity, Urine: 1.025 (ref 1.005–1.030)
Urobilinogen, UA: 0.2 mg/dL (ref 0.0–1.0)
pH: 5.5 (ref 5.0–8.0)

## 2014-11-12 LAB — CBC WITH DIFFERENTIAL/PLATELET
Basophils Absolute: 0.1 K/uL (ref 0.0–0.1)
Basophils Relative: 1 % (ref 0–1)
Eosinophils Absolute: 0.4 K/uL (ref 0.0–0.7)
Eosinophils Relative: 4 % (ref 0–5)
HCT: 41 % (ref 36.0–46.0)
Hemoglobin: 13.3 g/dL (ref 12.0–15.0)
Lymphocytes Relative: 49 % — ABNORMAL HIGH (ref 12–46)
Lymphs Abs: 5.1 K/uL — ABNORMAL HIGH (ref 0.7–4.0)
MCH: 29.8 pg (ref 26.0–34.0)
MCHC: 32.4 g/dL (ref 30.0–36.0)
MCV: 91.7 fL (ref 78.0–100.0)
Monocytes Absolute: 0.7 K/uL (ref 0.1–1.0)
Monocytes Relative: 7 % (ref 3–12)
Neutro Abs: 4 K/uL (ref 1.7–7.7)
Neutrophils Relative %: 39 % — ABNORMAL LOW (ref 43–77)
Platelets: 333 K/uL (ref 150–400)
RBC: 4.47 MIL/uL (ref 3.87–5.11)
RDW: 13.4 % (ref 11.5–15.5)
WBC: 10.3 K/uL (ref 4.0–10.5)

## 2014-11-12 LAB — URINE MICROSCOPIC-ADD ON

## 2014-11-12 LAB — POC URINE PREG, ED: PREG TEST UR: NEGATIVE

## 2014-11-12 NOTE — ED Notes (Signed)
Pt's name called [place] to a room x2 no answer

## 2014-11-12 NOTE — ED Notes (Signed)
Patien presents with c/o bilateral hand pain and swelling in her legs and feet and her shoulders

## 2014-11-12 NOTE — ED Notes (Signed)
Called out to recheck patient vitals with no answer.Marland KitchenMarland Kitchen

## 2014-11-13 MED ORDER — TRAMADOL HCL 50 MG PO TABS: 50.0000 mg | ORAL_TABLET | Freq: Two times a day (BID) | ORAL | Status: DC | PRN

## 2014-11-13 MED ORDER — FUROSEMIDE 20 MG PO TABS: 20.0000 mg | ORAL_TABLET | Freq: Every day | ORAL | Status: AC

## 2014-11-13 NOTE — Discharge Instructions (Signed)
Peripheral Neuropathy Nicole Mullen, see a primary care physician or neurologist within 3 days for close follow up of the nerve pain in your thumbs.  Take ibuprofen at home for pain.  If it becomes severe, take tramadol.  Take lasix for 5 days to help with leg swelling.  If any symptoms worsen, come back to the ED immediately.  Thank you. Peripheral neuropathy is a type of nerve damage. It affects nerves that carry signals between the spinal cord and other parts of the body. These are called peripheral nerves. With peripheral neuropathy, one nerve or a group of nerves may be damaged.  CAUSES  Many things can damage peripheral nerves. For some people with peripheral neuropathy, the cause is unknown. Some causes include:  Diabetes. This is the most common cause of peripheral neuropathy.  Injury to a nerve.  Pressure or stress on a nerve that lasts a long time.  Too little vitamin B. Alcoholism can lead to this.  Infections.  Autoimmune diseases, such as multiple sclerosis and systemic lupus erythematosus.  Inherited nerve diseases.  Some medicines, such as cancer drugs.  Toxic substances, such as lead and mercury.  Too little blood flowing to the legs.  Kidney disease.  Thyroid disease. SIGNS AND SYMPTOMS  Different people have different symptoms. The symptoms you have will depend on which of your nerves is damaged. Common symptoms include:  Loss of feeling (numbness) in the feet and hands.  Tingling in the feet and hands.  Pain that burns.  Very sensitive skin.  Weakness.  Not being able to move a part of the body (paralysis).  Muscle twitching.  Clumsiness or poor coordination.  Loss of balance.  Not being able to control your bladder.  Feeling dizzy.  Sexual problems. DIAGNOSIS  Peripheral neuropathy is a symptom, not a disease. Finding the cause of peripheral neuropathy can be hard. To figure that out, your health care provider will take a medical history and do  a physical exam. A neurological exam will also be done. This involves checking things affected by your brain, spinal cord, and nerves (nervous system). For example, your health care provider will check your reflexes, how you move, and what you can feel.  Other types of tests may also be ordered, such as:  Blood tests.  A test of the fluid in your spinal cord.  Imaging tests, such as CT scans or an MRI.  Electromyography (EMG). This test checks the nerves that control muscles.  Nerve conduction velocity tests. These tests check how fast messages pass through your nerves.  Nerve biopsy. A small piece of nerve is removed. It is then checked under a microscope. TREATMENT   Medicine is often used to treat peripheral neuropathy. Medicines may include:  Pain-relieving medicines. Prescription or over-the-counter medicine may be suggested.  Antiseizure medicine. This may be used for pain.  Antidepressants. These also may help ease pain from neuropathy.  Lidocaine. This is a numbing medicine. You might wear a patch or be given a shot.  Mexiletine. This medicine is typically used to help control irregular heart rhythms.  Surgery. Surgery may be needed to relieve pressure on a nerve or to destroy a nerve that is causing pain.  Physical therapy to help movement.  Assistive devices to help movement. HOME CARE INSTRUCTIONS   Only take over-the-counter or prescription medicines as directed by your health care provider. Follow the instructions carefully for any given medicines. Do not take any other medicines without first getting approval from your  health care provider.  If you have diabetes, work closely with your health care provider to keep your blood sugar under control.  If you have numbness in your feet:  Check every day for signs of injury or infection. Watch for redness, warmth, and swelling.  Wear padded socks and comfortable shoes. These help protect your feet.  Do not do  things that put pressure on your damaged nerve.  Do not smoke. Smoking keeps blood from getting to damaged nerves.  Avoid or limit alcohol. Too much alcohol can cause a lack of B vitamins. These vitamins are needed for healthy nerves.  Develop a good support system. Coping with peripheral neuropathy can be stressful. Talk to a mental health specialist or join a support group if you are struggling.  Follow up with your health care provider as directed. SEEK MEDICAL CARE IF:   You have new signs or symptoms of peripheral neuropathy.  You are struggling emotionally from dealing with peripheral neuropathy.  You have a fever. SEEK IMMEDIATE MEDICAL CARE IF:   You have an injury or infection that is not healing.  You feel very dizzy or begin vomiting.  You have chest pain.  You have trouble breathing. Document Released: 03/12/2002 Document Revised: 12/02/2010 Document Reviewed: 11/27/2012 [facility] Patient Information 2015 [place], [place]. This information is not intended to replace advice given to you by your health care provider. Make sure you discuss any questions you have with your health care provider. Edema Edema is an abnormal buildup of fluids. It is more common in your legs and thighs. Painless swelling of the feet and ankles is more likely as a person ages. It also is common in looser skin, like around your eyes. HOME CARE   Keep the affected body part above the level of the heart while lying down.  Do not sit still or stand for a long time.  Do not put anything right under your knees when you lie down.  Do not wear tight clothes on your upper legs.  Exercise your legs to help the puffiness (swelling) go down.  Wear elastic bandages or support stockings as told by your doctor.  A low-salt diet may help lessen the puffiness.  Only take medicine as told by your doctor. GET HELP IF:  Treatment is not working.  You have heart, liver, or kidney disease and notice that  your skin looks puffy or shiny.  You have puffiness in your legs that does not get better when you raise your legs.  You have sudden weight gain for no reason. GET HELP RIGHT AWAY IF:   You have shortness of breath or chest pain.  You cannot breathe when you lie down.  You have pain, redness, or warmth in the areas that are puffy.  You have heart, liver, or kidney disease and get edema all of a sudden.  You have a fever and your symptoms get worse all of a sudden. MAKE SURE YOU:   Understand these instructions.  Will watch your condition.  Will get help right away if you are not doing well or get worse. Document Released: 09/08/2007 Document Revised: 03/27/2013 Document Reviewed: 01/12/2013 [facility] Patient Information 2015 [place], [place]. This information is not intended to replace advice given to you by your health care provider. Make sure you discuss any questions you have with your health care provider.

## 2014-11-13 NOTE — ED Provider Notes (Signed)
CSN: [id number]     Arrival date & time 11/12/14  1857 History  This chart was scribed for Everlene Balls, MD by Rayna Sexton, ED scribe. This patient was seen in room A12C/A12C and the patient's care was started at 2:41 AM.   Chief Complaint  Patient presents with  . Hand Pain  . Edema   The history is provided by the patient. No language interpreter was used.    HPI Comments: Nicole Mullen is a 39 y.o. female who presents to the Emergency Department complaining of constant, moderate, pain and swelling to her bilateral hands with onset 2 months ago. Pt notes that the pain is focused on both thumbs of her hands, describes it as pins and further notes it worsens with movement or palpation.   She also notes pain and swelling to her bilateral feet, legs and shoulders. Pt notes associating itching to her bilateral legs due to the swelling.   Pt denies taking lasix. She notes currently going to school but denies doing an irregular amount of movement with her hands. She denies any other associated symptoms.  Past Medical History  Diagnosis Date  . Depression    Past Surgical History  Procedure Laterality Date  . Tubal ligation     No family history on file. Social History  Substance Use Topics  . Smoking status: Current Every Day Smoker    Types: Cigarettes  . Smokeless tobacco: Never Used  . Alcohol Use: No   OB History    No data available     Review of Systems A complete 10 system review of systems was obtained and all systems are negative except as noted in the HPI and PMH.   Allergies  Other  Home Medications   Prior to Admission medications   Medication Sig Start Date End Date Taking? Authorizing Provider  ibuprofen (ADVIL,MOTRIN) 200 MG tablet Take 400 mg by mouth every 6 (six) hours as needed for mild pain.   Yes Historical Provider, MD  traZODone (DESYREL) 50 MG tablet Take 25 mg by mouth at bedtime. Take 1/2 to 1 tablet   Yes Historical Provider, MD   BP 113/66  mmHg  Pulse 75  Temp(Src) 98.2 F (36.8 C) (Oral)  Resp 16  Ht 5\' 7"  (1.702 m)  Wt 312 lb 12.8 oz (141.885 kg)  BMI 48.98 kg/m2  SpO2 95%  LMP 12/16/2012 (LMP Unknown) Physical Exam  Constitutional: She is oriented to person, place, and time. She appears well-developed and well-nourished. No distress.  HENT:  Head: Normocephalic and atraumatic.  Nose: Nose normal.  Mouth/Throat: Oropharynx is clear and moist. No oropharyngeal exudate.  Eyes: Conjunctivae and EOM are normal. Pupils are equal, round, and reactive to light. No scleral icterus.  Neck: Normal range of motion. Neck supple. No JVD present. No tracheal deviation present. No thyromegaly present.  Cardiovascular: Normal rate, regular rhythm and normal heart sounds.  Exam reveals no gallop and no friction rub.   No murmur heard. Pulmonary/Chest: Effort normal and breath sounds normal. No respiratory distress. She has no wheezes. She exhibits no tenderness.  Abdominal: Soft. Bowel sounds are normal. She exhibits no distension and no mass. There is no tenderness. There is no rebound and no guarding.  Musculoskeletal: Normal range of motion. She exhibits no edema or tenderness.  Lymphadenopathy:    She has no cervical adenopathy.  Neurological: She is alert and oriented to person, place, and time. No cranial nerve deficit. She exhibits normal muscle tone.  Skin: Skin  is warm and dry. No rash noted. No erythema. No pallor.  Nursing note and vitals reviewed.   ED Course  Procedures  DIAGNOSTIC STUDIES: Oxygen Saturation is 95% on RA, normal by my interpretation.    COORDINATION OF CARE: 2:46 AM Discussed treatment plan with pt at bedside and pt agreed to plan.  Labs Review Labs Reviewed  CBC WITH DIFFERENTIAL/PLATELET - Abnormal; Notable for the following:    Neutrophils Relative % 39 (*)    Lymphocytes Relative 49 (*)    Lymphs Abs 5.1 (*)    All other components within normal limits  COMPREHENSIVE METABOLIC PANEL -  Abnormal; Notable for the following:    Creatinine, Ser 1.02 (*)    All other components within normal limits  URINALYSIS, ROUTINE W REFLEX MICROSCOPIC (NOT AT [facility]) - Abnormal; Notable for the following:    Leukocytes, UA SMALL (*)    All other components within normal limits  URINE MICROSCOPIC-ADD ON - Abnormal; Notable for the following:    Squamous Epithelial / LPF FEW (*)    Bacteria, UA FEW (*)    All other components within normal limits  POC URINE PREG, ED    Imaging Review No results found.   EKG Interpretation None      MDM   Final diagnoses:  None   Patient presents emergency department for bilateral pain at her thumbs. She states it is sensitive to light touch. She is advised to see her primary care physician or neurology for close follow-up. She'll be given a couple tablets of tramadol for pain control. She also complains of worsening swelling of her bilateral lower extremities. She denies any history of heart, liver, or kidney failure. Laboratory studies unremarkable. Will place patient on 5 day course of Lasix and again, encouraged her to see her primary care physician for repeat evaluation. She demonstrated understanding. She appears well and in no acute distress. Her vital signs remain within her normal limits and she is safe for discharge.  I personally performed the services described in this documentation, which was scribed in my presence. The recorded information has been reviewed and is accurate.    Everlene Balls, MD 11/13/14 [id number]

## 2014-11-13 NOTE — ED Notes (Signed)
Pt acknowledges taking all of her belongings home. Pt given sandwiches and bus passes for her child and herself

## 2015-11-18 ENCOUNTER — Ambulatory Visit (HOSPITAL_COMMUNITY)
Admission: EM | Admit: 2015-11-18 | Discharge: 2015-11-18 | Disposition: A | Discharge status: Home / Self Care | Payer: Medicaid Other | Attending: Family Medicine | Admitting: Family Medicine

## 2015-11-18 ENCOUNTER — Encounter (HOSPITAL_COMMUNITY): Payer: Self-pay | Admitting: Family Medicine

## 2015-11-18 DIAGNOSIS — M545 Low back pain, unspecified: Secondary | ICD-10-CM

## 2015-11-18 DIAGNOSIS — S39012A Strain of muscle, fascia and tendon of lower back, initial encounter: Secondary | ICD-10-CM

## 2015-11-18 MED ORDER — METHOCARBAMOL 500 MG PO TABS: 500.0000 mg | ORAL_TABLET | Freq: Two times a day (BID) | ORAL | 0 refills | Status: DC

## 2015-11-18 MED ORDER — DICLOFENAC POTASSIUM 50 MG PO TABS: 50.0000 mg | ORAL_TABLET | Freq: Three times a day (TID) | ORAL | 0 refills | Status: DC

## 2015-11-18 MED ORDER — TRAMADOL HCL 50 MG PO TABS: 50.0000 mg | ORAL_TABLET | Freq: Four times a day (QID) | ORAL | 0 refills | Status: DC | PRN
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
## 2016-08-25 ENCOUNTER — Other Ambulatory Visit: Payer: Self-pay | Admitting: Internal Medicine

## 2016-08-25 DIAGNOSIS — Z1231 Encounter for screening mammogram for malignant neoplasm of breast: Secondary | ICD-10-CM

## 2016-09-15 ENCOUNTER — Ambulatory Visit: Payer: Medicaid Other

## 2016-09-29 ENCOUNTER — Ambulatory Visit
Admission: RE | Admit: 2016-09-29 | Discharge: 2016-09-29 | Disposition: A | Discharge status: Home / Self Care | Payer: Medicaid Other | Source: Ambulatory Visit | Attending: Internal Medicine | Admitting: Internal Medicine

## 2016-09-29 DIAGNOSIS — Z1231 Encounter for screening mammogram for malignant neoplasm of breast: Secondary | ICD-10-CM

## 2016-09-30 ENCOUNTER — Other Ambulatory Visit: Payer: Self-pay | Admitting: Internal Medicine

## 2016-09-30 DIAGNOSIS — R928 Other abnormal and inconclusive findings on diagnostic imaging of breast: Secondary | ICD-10-CM

## 2016-10-07 ENCOUNTER — Ambulatory Visit
Admission: RE | Admit: 2016-10-07 | Discharge: 2016-10-07 | Disposition: A | Discharge status: Home / Self Care | Payer: Medicaid Other | Source: Ambulatory Visit | Attending: Internal Medicine | Admitting: Internal Medicine

## 2016-10-07 ENCOUNTER — Other Ambulatory Visit: Payer: Self-pay | Admitting: Internal Medicine

## 2016-10-07 DIAGNOSIS — R928 Other abnormal and inconclusive findings on diagnostic imaging of breast: Secondary | ICD-10-CM

## 2016-10-07 DIAGNOSIS — N63 Unspecified lump in unspecified breast: Secondary | ICD-10-CM

## 2016-10-18 ENCOUNTER — Ambulatory Visit (INDEPENDENT_AMBULATORY_CARE_PROVIDER_SITE_OTHER): Payer: Medicaid Other | Admitting: Podiatry

## 2016-10-18 DIAGNOSIS — Q828 Other specified congenital malformations of skin: Secondary | ICD-10-CM

## 2016-10-18 DIAGNOSIS — B353 Tinea pedis: Secondary | ICD-10-CM | POA: Diagnosis not present

## 2016-10-18 MED ORDER — CLOTRIMAZOLE-BETAMETHASONE 1-0.05 % EX CREA: 1.0000 "application " | TOPICAL_CREAM | Freq: Two times a day (BID) | CUTANEOUS | 2 refills | Status: DC

## 2016-10-18 MED ORDER — TERBINAFINE HCL 250 MG PO TABS: 250.0000 mg | ORAL_TABLET | Freq: Every day | ORAL | 0 refills | Status: DC

## 2016-10-25 NOTE — Progress Notes (Signed)
   Subjective: Patient presents to the office today for chief complaint of painful callus lesions of the feet. Patient states that the pain is ongoing and is affecting their ability to ambulate without pain. Patient presents today for further treatment and evaluation.  Objective:  Physical Exam General: Alert and oriented x3 in no acute distress  Dermatology: Hyperkeratotic lesion present on the right plantar heel. Pain on palpation with a central nucleated core noted.  Skin is warm, dry and supple bilateral lower extremities. Negative for open lesions or macerations. Diffuse hyperkeratosis with pruritus noted to the bilateral feet as well. Consistent with tinea pedis. Vascular: Palpable pedal pulses bilaterally. No edema or erythema noted. Capillary refill within normal limits.  Neurological: Epicritic and protective threshold grossly intact bilaterally.   Musculoskeletal Exam: Pain on palpation at the keratotic lesion noted. Range of motion within normal limits bilateral. Muscle strength 5/5 in all groups bilateral.  Assessment: #1 porokeratosis right plantar heel #2 tinea pedis bilateral   Plan of Care:  #1 Patient evaluated #2 Excisional debridement of keratoic lesion using a chisel blade was performed without incident.  #3 Treated area(s) with Salinocaine and dressed with light dressing. #4 prescription for Lamisil tablets 50 mg #28  #5 prescription for Lotrisone cream  #6 Patient is to return to the clinic PRN.   Edrick Kins, DPM [facility]  Dr. Edrick Kins, [address]                                        [place], [place] [address]                Office [phone]  Fax [phone]

## 2017-04-11 ENCOUNTER — Ambulatory Visit: Payer: Medicaid Other

## 2017-04-11 ENCOUNTER — Ambulatory Visit
Admission: RE | Admit: 2017-04-11 | Discharge: 2017-04-11 | Disposition: A | Discharge status: Home / Self Care | Payer: Medicaid Other | Source: Ambulatory Visit | Attending: Internal Medicine | Admitting: Internal Medicine

## 2017-04-11 DIAGNOSIS — N63 Unspecified lump in unspecified breast: Secondary | ICD-10-CM

## 2018-02-27 ENCOUNTER — Ambulatory Visit (INDEPENDENT_AMBULATORY_CARE_PROVIDER_SITE_OTHER): Payer: Medicaid Other | Admitting: Physician Assistant

## 2018-12-27 ENCOUNTER — Other Ambulatory Visit: Payer: Self-pay | Admitting: Nurse Practitioner

## 2018-12-29 ENCOUNTER — Other Ambulatory Visit: Payer: Self-pay

## 2018-12-29 DIAGNOSIS — Z20822 Contact with and (suspected) exposure to covid-19: Secondary | ICD-10-CM

## 2018-12-30 LAB — NOVEL CORONAVIRUS, NAA: SARS-CoV-2, NAA: NOT DETECTED

## 2019-01-08 ENCOUNTER — Ambulatory Visit: Payer: Self-pay | Admitting: Podiatry

## 2019-01-29 ENCOUNTER — Other Ambulatory Visit: Payer: Self-pay | Admitting: Nurse Practitioner

## 2019-01-29 ENCOUNTER — Ambulatory Visit
Admission: RE | Admit: 2019-01-29 | Discharge: 2019-01-29 | Disposition: A | Discharge status: Home / Self Care | Payer: Medicaid Other | Source: Ambulatory Visit | Attending: Nurse Practitioner | Admitting: Nurse Practitioner

## 2019-01-29 ENCOUNTER — Ambulatory Visit: Payer: Medicaid Other | Admitting: Podiatry

## 2019-01-29 ENCOUNTER — Other Ambulatory Visit: Payer: Self-pay

## 2019-01-29 ENCOUNTER — Ambulatory Visit (INDEPENDENT_AMBULATORY_CARE_PROVIDER_SITE_OTHER): Payer: Medicaid Other

## 2019-01-29 DIAGNOSIS — M659 Synovitis and tenosynovitis, unspecified: Secondary | ICD-10-CM

## 2019-01-29 DIAGNOSIS — M7751 Other enthesopathy of right foot: Secondary | ICD-10-CM

## 2019-01-29 DIAGNOSIS — M25542 Pain in joints of left hand: Secondary | ICD-10-CM

## 2019-01-29 DIAGNOSIS — L989 Disorder of the skin and subcutaneous tissue, unspecified: Secondary | ICD-10-CM

## 2019-01-29 DIAGNOSIS — M5489 Other dorsalgia: Secondary | ICD-10-CM

## 2019-01-29 DIAGNOSIS — D492 Neoplasm of unspecified behavior of bone, soft tissue, and skin: Secondary | ICD-10-CM

## 2019-01-29 DIAGNOSIS — M79671 Pain in right foot: Secondary | ICD-10-CM

## 2019-01-30 ENCOUNTER — Telehealth: Payer: Self-pay | Admitting: Podiatry

## 2019-01-30 ENCOUNTER — Other Ambulatory Visit: Payer: Self-pay | Admitting: Podiatry

## 2019-01-30 DIAGNOSIS — M659 Synovitis and tenosynovitis, unspecified: Secondary | ICD-10-CM

## 2019-01-30 NOTE — Telephone Encounter (Signed)
Wanted to see if the medication  that Dr. Amalia Hailey see if he sent to her pharmacy ([facility] [phone]) if so can someone give her a call

## 2019-01-31 ENCOUNTER — Other Ambulatory Visit: Payer: Self-pay | Admitting: Podiatry

## 2019-01-31 ENCOUNTER — Telehealth: Payer: Self-pay | Admitting: Podiatry

## 2019-01-31 MED ORDER — HYDROCODONE-ACETAMINOPHEN 5-325 MG PO TABS: 1.0000 | ORAL_TABLET | Freq: Four times a day (QID) | ORAL | 0 refills | Status: DC | PRN

## 2019-01-31 NOTE — Telephone Encounter (Signed)
Pt was seen in office on Monday 10/26 for bilateral ankle pain and a callus trim. Pt would like to know if the doctor can send in a prescription for her pain.   Pharmacy is [facility] [address]

## 2019-01-31 NOTE — Progress Notes (Signed)
PRN pain 

## 2019-01-31 NOTE — Progress Notes (Signed)
   Subjective:  43 y.o. female presenting today for follow up evaluation of a painful callus lesion of the right heel. She states the callus was better after her last visit but has since returned. She has not done anything at home for treatment. Walking on the foot for long periods of time makes the pain worse. She is also complaining of medial right ankle pain that began two weeks ago. Walking and standing increases the pain. She has not done anything for treatment. Patient is here for further evaluation and treatment.   Past Medical History:  Diagnosis Date  . Depression      Objective / Physical Exam:  General:  The patient is alert and oriented x3 in no acute distress. Dermatology:  Hyperkeratotic lesion(s) present on the right heel. Pain on palpation with a central nucleated core noted. Skin is warm, dry and supple bilateral lower extremities. Negative for open lesions or macerations. Vascular:  Palpable pedal pulses bilaterally. No edema or erythema noted. Capillary refill within normal limits. Neurological:  Epicritic and protective threshold grossly intact bilaterally.  Musculoskeletal Exam:  Pain on palpation to the anterior lateral medial aspects of the patient's right ankle. Mild edema noted. Range of motion within normal limits to all pedal and ankle joints bilateral. Muscle strength 5/5 in all groups bilateral.   Radiographic Exam:  Normal osseous mineralization. Joint spaces preserved. No fracture/dislocation/boney destruction.    Assessment: 1. Porokeratosis right heel  2. Ankle synovitis right   Plan of Care:  1. Patient was evaluated. X-Rays reviewed.  2. Injection of 0.5 mL Celestone Soluspan injected in the patient's right ankle. 3. Prescription for Meloxicam provided to patient. 4. Excisional debridement of keratotic lesion(s) using a chisel blade was performed without incident. Cantharone applied and light dressing placed.  5. Return to clinic in 2 weeks.    Edrick Kins, DPM [facility]  Dr. Edrick Kins, [address]                                        [place], [place] [address]                Office [phone]  Fax [phone]

## 2019-02-12 ENCOUNTER — Ambulatory Visit: Payer: Medicaid Other | Admitting: Podiatry

## 2019-02-12 ENCOUNTER — Other Ambulatory Visit: Payer: Self-pay

## 2019-02-12 DIAGNOSIS — M7751 Other enthesopathy of right foot: Secondary | ICD-10-CM | POA: Diagnosis not present

## 2019-02-12 DIAGNOSIS — M659 Synovitis and tenosynovitis, unspecified: Secondary | ICD-10-CM

## 2019-02-12 DIAGNOSIS — L989 Disorder of the skin and subcutaneous tissue, unspecified: Secondary | ICD-10-CM

## 2019-02-15 NOTE — Progress Notes (Signed)
   Subjective:  43 y.o. female presenting today for follow up evaluation of a painful callus lesion of the right heel and right ankle pain. She states the right ankle pain has improved but is still present in the medial aspect. She states the heel lesion has improved and she can now bear more weight without pain. She has been taking Meloxicam for treatment. Patient is here for further evaluation and treatment.   Past Medical History:  Diagnosis Date  . Depression      Objective / Physical Exam:  General:  The patient is alert and oriented x3 in no acute distress. Dermatology:  Hyperkeratotic lesion(s) present on the right heel. Pain on palpation with a central nucleated core noted. Skin is warm, dry and supple bilateral lower extremities. Negative for open lesions or macerations. Vascular:  Palpable pedal pulses bilaterally. No edema or erythema noted. Capillary refill within normal limits. Neurological:  Epicritic and protective threshold grossly intact bilaterally.  Musculoskeletal Exam:  Pain on palpation to the anterior lateral medial aspects of the patient's right ankle. Mild edema noted. Range of motion within normal limits to all pedal and ankle joints bilateral. Muscle strength 5/5 in all groups bilateral.   Assessment: 1. Porokeratosis right heel  2. Ankle synovitis right   Plan of Care:  1. Patient was evaluated. 2. Injection of 0.5 mL Celestone Soluspan injected in the patient's right ankle. 3. Continue taking Meloxicam as needed.  4. Excisional debridement of keratotic lesion(s) using a chisel blade was performed without incident. Salinocaine applied and light dressing placed.  5. Return to clinic as needed.    Edrick Kins, DPM [facility]  Dr. Edrick Kins, [address]                                        [place], [place] [address]                Office [phone]  Fax [phone]

## 2019-03-06 ENCOUNTER — Other Ambulatory Visit: Payer: Self-pay

## 2019-03-06 DIAGNOSIS — Z20822 Contact with and (suspected) exposure to covid-19: Secondary | ICD-10-CM

## 2019-03-09 LAB — NOVEL CORONAVIRUS, NAA: SARS-CoV-2, NAA: NOT DETECTED

## 2019-05-14 ENCOUNTER — Ambulatory Visit: Payer: Medicaid Other | Attending: Internal Medicine

## 2019-05-14 DIAGNOSIS — Z20822 Contact with and (suspected) exposure to covid-19: Secondary | ICD-10-CM

## 2019-05-15 LAB — NOVEL CORONAVIRUS, NAA: SARS-CoV-2, NAA: NOT DETECTED

## 2019-05-22 ENCOUNTER — Other Ambulatory Visit (HOSPITAL_COMMUNITY)
Admission: RE | Admit: 2019-05-22 | Discharge: 2019-05-22 | Disposition: A | Discharge status: Home / Self Care | Payer: Medicaid Other | Source: Ambulatory Visit | Attending: Family Medicine | Admitting: Family Medicine

## 2019-05-22 DIAGNOSIS — Z124 Encounter for screening for malignant neoplasm of cervix: Secondary | ICD-10-CM | POA: Diagnosis not present

## 2019-06-08 LAB — CYTOLOGY - PAP
Chlamydia: NEGATIVE
Comment: NEGATIVE
Comment: NEGATIVE
Comment: NEGATIVE
Comment: NEGATIVE
Comment: NORMAL
Diagnosis: NEGATIVE
HSV1: NEGATIVE
HSV2: NEGATIVE
High risk HPV: NEGATIVE
Neisseria Gonorrhea: NEGATIVE
Trichomonas: NEGATIVE

## 2019-08-08 ENCOUNTER — Ambulatory Visit: Payer: Medicaid Other | Admitting: Podiatry

## 2019-08-27 ENCOUNTER — Ambulatory Visit: Payer: Medicaid Other | Admitting: Podiatry

## 2020-02-04 ENCOUNTER — Ambulatory Visit (INDEPENDENT_AMBULATORY_CARE_PROVIDER_SITE_OTHER): Payer: Medicaid Other

## 2020-02-04 ENCOUNTER — Ambulatory Visit (INDEPENDENT_AMBULATORY_CARE_PROVIDER_SITE_OTHER): Payer: Medicaid Other | Admitting: Podiatry

## 2020-02-04 ENCOUNTER — Other Ambulatory Visit: Payer: Self-pay

## 2020-02-04 DIAGNOSIS — M7751 Other enthesopathy of right foot: Secondary | ICD-10-CM

## 2020-02-04 DIAGNOSIS — M7750 Other enthesopathy of unspecified foot: Secondary | ICD-10-CM

## 2020-02-04 DIAGNOSIS — L989 Disorder of the skin and subcutaneous tissue, unspecified: Secondary | ICD-10-CM

## 2020-02-04 DIAGNOSIS — M659 Synovitis and tenosynovitis, unspecified: Secondary | ICD-10-CM

## 2020-02-04 NOTE — Progress Notes (Signed)
   Subjective:  44 y.o. female presenting today for recurrence of and evaluation for a painful callus lesion of the right heel and right ankle pain.patient was last seen here in the office on 02/12/2019. She states that she was feeling good however over the last few months the calluses and ankle pain has recurred. Patient is here for further evaluation and treatment.   Past Medical History:  Diagnosis Date  . Depression      Objective / Physical Exam:  General:  The patient is alert and oriented x3 in no acute distress. Dermatology:  Hyperkeratotic lesion(s) present on the right heel. Pain on palpation with a central nucleated core noted. Skin is warm, dry and supple bilateral lower extremities. Negative for open lesions or macerations. Vascular:  Palpable pedal pulses bilaterally. No edema or erythema noted. Capillary refill within normal limits. Neurological:  Epicritic and protective threshold grossly intact bilaterally.  Musculoskeletal Exam:  Pain on palpation to the anterior lateral medial aspects of the patient's right ankle. Mild edema noted. Range of motion within normal limits to all pedal and ankle joints bilateral. Muscle strength 5/5 in all groups bilateral.   Assessment: 1. Porokeratosis right heel  2. Ankle synovitis right   Plan of Care:  1. Patient was evaluated. 2. Injection of 0.5 mL Celestone Soluspan injected in the patient's right ankle. 3. Continue taking Meloxicam as needed.  4. Excisional debridement of keratotic lesion(s) using a chisel blade was performed without incident. Salinocaine applied and light dressing placed. Recommend OTC corn and callus remover daily 5. Return to clinic 4 weeks   Edrick Kins, DPM [facility]  Dr. Edrick Kins, [address]                                        [place],  [address]                Office [phone]  Fax [phone]

## 2020-03-03 ENCOUNTER — Ambulatory Visit (INDEPENDENT_AMBULATORY_CARE_PROVIDER_SITE_OTHER): Payer: Medicaid Other | Admitting: Podiatry

## 2020-03-03 ENCOUNTER — Other Ambulatory Visit: Payer: Self-pay

## 2020-03-03 DIAGNOSIS — M79675 Pain in left toe(s): Secondary | ICD-10-CM | POA: Diagnosis not present

## 2020-03-03 DIAGNOSIS — L600 Ingrowing nail: Secondary | ICD-10-CM

## 2020-03-03 DIAGNOSIS — B353 Tinea pedis: Secondary | ICD-10-CM | POA: Diagnosis not present

## 2020-03-03 DIAGNOSIS — B351 Tinea unguium: Secondary | ICD-10-CM | POA: Diagnosis not present

## 2020-03-03 DIAGNOSIS — M79674 Pain in right toe(s): Secondary | ICD-10-CM

## 2020-03-03 MED ORDER — CLOTRIMAZOLE-BETAMETHASONE 1-0.05 % EX CREA: 1.0000 "application " | TOPICAL_CREAM | Freq: Two times a day (BID) | CUTANEOUS | 3 refills | Status: DC

## 2020-03-03 MED ORDER — TERBINAFINE HCL 250 MG PO TABS: 250.0000 mg | ORAL_TABLET | Freq: Every day | ORAL | 0 refills | Status: DC

## 2020-03-03 MED ORDER — GENTAMICIN SULFATE 0.1 % EX CREA: 1.0000 "application " | TOPICAL_CREAM | Freq: Two times a day (BID) | CUTANEOUS | 1 refills | Status: DC

## 2020-03-03 NOTE — Progress Notes (Signed)
   Subjective: Patient presents today for evaluation of pain to the medial border of the bilateral great toes. Patient is concerned for possible ingrown nail.  Patient also complains of dry hyperkeratotic itching skin to the bilateral feet.  Patient presents today for further treatment and evaluation.  Past Medical History:  Diagnosis Date  . Depression     Objective:  General: Well developed, nourished, in no acute distress, alert and oriented x3   Dermatology: Skin is warm, dry and supple bilateral.  Medial border bilateral great toes appears to be erythematous with evidence of an ingrowing nail. Pain on palpation noted to the border of the nail fold.  Hyperkeratotic discolored dystrophic nails noted 1-5 bilateral consistent with onychomycosis. There are no open sores, lesions.  There is also hyperkeratotic skin diffusely throughout the weightbearing surface of the feet suggestive of a chronic tinea pedis  Vascular: Dorsalis Pedis artery and Posterior Tibial artery pedal pulses palpable. No lower extremity edema noted.   Neruologic: Grossly intact via light touch bilateral.  Musculoskeletal: Muscular strength within normal limits in all groups bilateral. Normal range of motion noted to all pedal and ankle joints.   Assesement: #1 Paronychia with ingrowing nail medial border bilateral great toes #2 Pain in toe #3  Onychomycosis of toenails bilateral #4 tinea pedis bilateral  Plan of Care:  1. Patient evaluated.  2. Discussed treatment alternatives and plan of care. Explained nail avulsion procedure and post procedure course to patient. 3. Patient opted for permanent partial nail avulsion of the medial border bilateral great toes.  4. Prior to procedure, local anesthesia infiltration utilized using 3 ml of a 50:50 mixture of 2% plain lidocaine and 0.5% plain marcaine in a normal hallux block fashion and a betadine prep performed.  5. Partial permanent nail avulsion with chemical  matrixectomy performed using [id number] applications of phenol followed by alcohol flush.  6. Light dressing applied. 7.  Prescription for gentamicin cream applied daily  8.  Prescription for Lotrisone cream apply 2 times daily to the feet  9.  Prescription for Lamisil turned 50 mg #90 daily.  Patient denies liver pathology or history of liver symptoms.   10.  Return to clinic 2 weeks.  Edrick Kins, DPM [facility]  Dr. Edrick Kins, [address]                                        [place], [place] [address]                Office [phone]  Fax [phone]

## 2020-03-17 ENCOUNTER — Ambulatory Visit: Payer: Medicaid Other | Admitting: Podiatry

## 2020-03-17 ENCOUNTER — Other Ambulatory Visit: Payer: Self-pay

## 2020-03-17 DIAGNOSIS — L600 Ingrowing nail: Secondary | ICD-10-CM

## 2020-03-17 DIAGNOSIS — L989 Disorder of the skin and subcutaneous tissue, unspecified: Secondary | ICD-10-CM | POA: Diagnosis not present

## 2020-03-19 NOTE — Progress Notes (Signed)
   Subjective: 44 y.o. female presents today status post permanent nail avulsion procedure of the medial border bilateral great toes that was performed on 03/03/2020.  Patient states that she is doing well.  The nails continue to be slightly tender however there is significant improvement.  She presents for further treatment evaluation  Past Medical History:  Diagnosis Date  . Depression     Objective: Skin is warm, dry and supple. Nail and respective nail fold appears to be healing appropriately. Open wound to the associated nail fold with a granular wound base and moderate amount of fibrotic tissue. Minimal drainage noted. Mild erythema around the periungual region likely due to phenol chemical matricectomy.  Assessment: #1 postop permanent partial nail avulsion medial border bilateral great toes #2 open wound periungual nail fold of respective digit.  #3 symptomatic calluses right foot  Plan of care: #1 patient was evaluated  #2 debridement of open wound was performed to the periungual border of the respective toe using a currette. Antibiotic ointment and Band-Aid was applied. #3  Continue OTC corn callus remover x4 weeks to the symptomatic callus lesions  #4 patient is to return to clinic on a PRN basis.   Edrick Kins, DPM [facility]  Dr. Edrick Kins, DPM    [address], [place] [address]                Office [phone]  Fax [phone]

## 2020-03-31 ENCOUNTER — Other Ambulatory Visit: Payer: Self-pay | Admitting: Podiatry

## 2020-03-31 NOTE — Telephone Encounter (Signed)
Please advise 

## 2020-04-30 ENCOUNTER — Ambulatory Visit: Payer: Medicaid Other | Admitting: Podiatry

## 2020-05-29 ENCOUNTER — Other Ambulatory Visit: Payer: Self-pay | Admitting: Podiatry

## 2020-05-29 NOTE — Telephone Encounter (Signed)
Please advise 

## 2020-11-21 ENCOUNTER — Emergency Department (HOSPITAL_BASED_OUTPATIENT_CLINIC_OR_DEPARTMENT_OTHER): Payer: Medicaid Other

## 2020-11-21 ENCOUNTER — Other Ambulatory Visit: Payer: Self-pay

## 2020-11-21 ENCOUNTER — Emergency Department (HOSPITAL_BASED_OUTPATIENT_CLINIC_OR_DEPARTMENT_OTHER)
Admission: EM | Admit: 2020-11-21 | Discharge: 2020-11-21 | Disposition: A | Discharge status: Home / Self Care | Payer: Medicaid Other | Attending: Emergency Medicine | Admitting: Emergency Medicine

## 2020-11-21 ENCOUNTER — Encounter (HOSPITAL_BASED_OUTPATIENT_CLINIC_OR_DEPARTMENT_OTHER): Payer: Self-pay | Admitting: Emergency Medicine

## 2020-11-21 DIAGNOSIS — Z7984 Long term (current) use of oral hypoglycemic drugs: Secondary | ICD-10-CM | POA: Diagnosis not present

## 2020-11-21 DIAGNOSIS — W19XXXA Unspecified fall, initial encounter: Secondary | ICD-10-CM

## 2020-11-21 DIAGNOSIS — F17210 Nicotine dependence, cigarettes, uncomplicated: Secondary | ICD-10-CM | POA: Insufficient documentation

## 2020-11-21 DIAGNOSIS — M79642 Pain in left hand: Secondary | ICD-10-CM | POA: Diagnosis not present

## 2020-11-21 DIAGNOSIS — W01198A Fall on same level from slipping, tripping and stumbling with subsequent striking against other object, initial encounter: Secondary | ICD-10-CM | POA: Diagnosis not present

## 2020-11-21 DIAGNOSIS — Z79899 Other long term (current) drug therapy: Secondary | ICD-10-CM | POA: Insufficient documentation

## 2020-11-21 MED ORDER — OXYCODONE-ACETAMINOPHEN 5-325 MG PO TABS
1.0000 | ORAL_TABLET | Freq: Once | ORAL | Status: AC
  Administered 2020-11-21: 1 via ORAL
  Filled 2020-11-21: qty 1

## 2020-11-21 NOTE — ED Triage Notes (Signed)
Brought by ems after slipping in water.  Reports her left hand went under the refrigerator causing her pointer finger to hyperextend.  C/o pain in all fingers on that hand except the middle.  Abrasion noted to top of the hand as well.

## 2020-11-21 NOTE — Discharge Instructions (Addendum)
Wear the splint until he follow-up with the hand doctor Dr. Fredna Dow.  Call his office first thing Monday and schedule an ER follow-up appointment.  Let them know that you were placed in a splint and need to be seen for follow-up.  Treat the splint like a cast and do not get it wet or take it off.  You can take Tylenol and ibuprofen for pain at home.  Return to the ER for any new or worsening symptoms.

## 2020-11-21 NOTE — ED Provider Notes (Signed)
[facility] EMERGENCY DEPARTMENT Provider Note   CSN: [id number] Arrival date & time: 11/21/20  1507     History Chief Complaint  Patient presents with   Hand Injury    Nicole Mullen is a 45 y.o. female with noncontributory past medical history presenting to emergency room today with chief complaint of left hand injury happening just prior to arrival.  Patient states she slipped on some water on the floor and fell to the ground.  Her left hand got stuck underneath the refrigerator for a few seconds.  When she was able to remove her hand she had throbbing pain located in all fingers except her left middle finger.  It is constant and worse with movement.  She rates the pain 9 out of 10 in severity.  Denies any numbness, tingling or weakness.  Tdap is up-to-date.  No medications prior to arrival.  She denies hitting her head or any loss of consciousness.    Past Medical History:  Diagnosis Date   Depression     There are no problems to display for this patient.   Past Surgical History:  Procedure Laterality Date   TUBAL LIGATION       OB History   No obstetric history on file.     Family History  Problem Relation Age of Onset   Breast cancer Neg Hx     Social History   Tobacco Use   Smoking status: Every Day    Types: Cigarettes   Smokeless tobacco: Never  Substance Use Topics   Alcohol use: No   Drug use: No    Home Medications Prior to Admission medications   Medication Sig Start Date End Date Taking? Authorizing Provider  clotrimazole-betamethasone (LOTRISONE) cream Apply 1 application topically 2 (two) times daily. 03/03/20   Edrick Kins, DPM  diclofenac (CATAFLAM) 50 MG tablet Take 1 tablet (50 mg total) by mouth 3 (three) times daily. One tablet TID with food prn pain. 11/18/15   Janne Napoleon, NP  furosemide (LASIX) 20 MG tablet Take 1 tablet (20 mg total) by mouth daily. 11/13/14   Everlene Balls, MD  gentamicin cream (GARAMYCIN) 0.1 % Apply 1  application topically 2 (two) times daily. 04/15/20   Edrick Kins, DPM  hydrochlorothiazide (HYDRODIURIL) 12.5 MG tablet Take 12.5 mg by mouth every morning. 12/18/18   [provider]  HYDROcodone-acetaminophen (NORCO/VICODIN) 5-325 MG tablet Take 1 tablet by mouth every 6 (six) hours as needed for moderate pain. 01/31/19   Edrick Kins, DPM  ibuprofen (ADVIL,MOTRIN) 200 MG tablet Take 400 mg by mouth every 6 (six) hours as needed for mild pain.    [provider]  metFORMIN (GLUCOPHAGE) 500 MG tablet Take 500 mg by mouth 2 (two) times daily. 12/18/18   [provider]  methocarbamol (ROBAXIN) 500 MG tablet Take 1 tablet (500 mg total) by mouth 2 (two) times daily. 11/18/15   Janne Napoleon, NP  naproxen (NAPROSYN) 500 MG tablet Take 500 mg by mouth 2 (two) times daily with a meal. 11/29/18   [provider]  terbinafine (LAMISIL) 250 MG tablet Take 1 tablet (250 mg total) by mouth daily. 03/03/20   Edrick Kins, DPM  traMADol (ULTRAM) 50 MG tablet Take 1 tablet (50 mg total) by mouth every 6 (six) hours as needed. 11/18/15   Janne Napoleon, NP  traZODone (DESYREL) 50 MG tablet Take 25 mg by mouth at bedtime. Take 1/2 to 1 tablet    [provider]  Allergies    Other  Review of Systems   Review of Systems  Physical Exam Updated Vital Signs BP (!) 152/81 (BP Location: Right Arm)   Pulse 97   Temp 97.8 F (36.6 C) (Oral)   Resp 18   Ht '5\' 6"'$  (1.676 m)   Wt (!) 149.4 kg   LMP 12/16/2012 (LMP Unknown)   SpO2 96%   BMI 53.17 kg/m   Physical Exam Vitals and nursing note reviewed.  Constitutional:      Appearance: She is well-developed. She is not ill-appearing or toxic-appearing.  HENT:     Head: Normocephalic and atraumatic.     Nose: Nose normal.  Eyes:     General: No scleral icterus.       Right eye: No discharge.        Left eye: No discharge.     Conjunctiva/sclera: Conjunctivae normal.  Neck:     Vascular: No JVD.   Cardiovascular:     Rate and Rhythm: Normal rate and regular rhythm.     Pulses: Normal pulses.          Radial pulses are 2+ on the right side.     Heart sounds: Normal heart sounds.  Pulmonary:     Effort: Pulmonary effort is normal.     Breath sounds: Normal breath sounds.  Abdominal:     General: There is no distension.  Musculoskeletal:        General: Normal range of motion.     Cervical back: Normal range of motion.     Comments: left hand with  tenderness to palpation of dorsal aspect and fingers 1, 2, 4. No swelling. There is no joint effusion noted. Decreased ROM 2/2 pain. No erythema or warmth overlaying the joint. There is anatomic snuff box tenderness. Normal sensation and motor function in the median, ulnar, and radial nerve distributions. 2+ radial pulse.  Patient has superficial skin tear on base of left thumb.  No active bleeding.  No foreign body seen.  Full range of motion of left shoulder and elbow.  Compartments are soft in left upper extremity.  Skin:    General: Skin is warm and dry.  Neurological:     Mental Status: She is oriented to person, place, and time.     GCS: GCS eye subscore is 4. GCS verbal subscore is 5. GCS motor subscore is 6.     Comments: Fluent speech, no facial droop.  Psychiatric:        Behavior: Behavior normal.    ED Results / Procedures / Treatments   Labs (all labs ordered are listed, but only abnormal results are displayed) Labs Reviewed - No data to display  EKG None  Radiology DG Hand Complete Left  Result Date: 11/21/2020 CLINICAL DATA:  Hand injury EXAM: LEFT HAND - COMPLETE 3+ VIEW COMPARISON:  None. FINDINGS: There is no evidence of fracture or dislocation. There is no evidence of arthropathy or other focal bone abnormality. Soft tissues are unremarkable. IMPRESSION: Negative. Electronically Signed   By: Donavan Foil M.D.   On: 11/21/2020 15:52    Procedures Procedures   Medications Ordered in ED Medications   oxyCODONE-acetaminophen (PERCOCET/ROXICET) 5-325 MG per tablet 1 tablet (has no administration in time range)    ED Course  I have reviewed the triage vital signs and the nursing notes.  Pertinent labs & imaging results that were available during my care of the patient were reviewed by me and considered in my medical decision  making (see chart for details).    MDM Rules/Calculators/A&P                           History provided by patient with additional history obtained from chart review.    Patient presents to the ED with complaints of pain to the  left hand s/p injury mechanical fall. Exam without obvious deformity or open wounds. ROM intact. Tender to palpation of her fingers and dorsal aspect of left hand.Marland Kitchen NVI distally. Xray negative for fracture/dislocation.  Does have anatomic snuffbox tenderness.  Therefore she will be placed in a thumb spica.  Patient will need to follow-up with on-call hand attending Dr. Fredna Dow.  I have given her information for this.  She was given a dose of Percocet for pain here in the ER.  Recommend Tylenol and ibuprofen for pain at home.   I discussed results, treatment plan, need for follow-up, and return precautions with the patient. Provided opportunity for questions, patient confirmed understanding and are in agreement with plan.    Portions of this note were generated with [employer]. Dictation errors may occur despite best attempts at proofreading.   Final Clinical Impression(s) / ED Diagnoses Final diagnoses:  Fall, initial encounter  Left hand pain    Rx / DC Orders ED Discharge Orders     None        Lewanda Rife 11/21/20 1656    Drenda Freeze, MD 11/21/20 [phone]

## 2021-01-12 ENCOUNTER — Ambulatory Visit (INDEPENDENT_AMBULATORY_CARE_PROVIDER_SITE_OTHER): Payer: Medicaid Other | Admitting: Podiatry

## 2021-01-12 ENCOUNTER — Other Ambulatory Visit: Payer: Self-pay

## 2021-01-12 DIAGNOSIS — B353 Tinea pedis: Secondary | ICD-10-CM

## 2021-01-12 DIAGNOSIS — L989 Disorder of the skin and subcutaneous tissue, unspecified: Secondary | ICD-10-CM

## 2021-01-12 MED ORDER — TERBINAFINE HCL 250 MG PO TABS: 250.0000 mg | ORAL_TABLET | Freq: Every day | ORAL | 0 refills | Status: DC

## 2021-01-12 NOTE — Progress Notes (Signed)
   Subjective: 45 y.o. female presenting to the office today for follow-up evaluation of pain and tenderness associated to callus development to the weightbearing surfaces of the bilateral feet and also the right heel.  She states that she has been applying corn callus remover with minimal improvement.  She did have relief last visit after debridement.  She was last seen in the office on 03/17/2020.  She states that the pain is slowly returned.   Past Medical History:  Diagnosis Date   Depression      Objective:  Physical Exam General: Alert and oriented x3 in no acute distress  Dermatology: Hyperkeratotic lesion(s) present on the right plantar heel as well as bilateral fifth metatarsal tubercles plantar. Pain on palpation with a central nucleated core noted. Skin is warm, dry and supple bilateral lower extremities. Negative for open lesions or macerations. There is also diffuse hyperkeratosis along the weightbearing surfaces of the feet  Vascular: Palpable pedal pulses bilaterally. No edema or erythema noted. Capillary refill within normal limits.  Neurological: Epicritic and protective threshold grossly intact bilaterally.   Musculoskeletal Exam: Pain on palpation at the keratotic lesion(s) noted. Range of motion within normal limits bilateral. Muscle strength 5/5 in all groups bilateral.  Assessment: 1.  Porokeratosis/symptomatic calluses bilateral feet 2.  Suspicion for chronic tinea pedis   Plan of Care:  1. Patient evaluated 2. Excisional debridement of keratoic lesion(s) using a chisel blade was performed without incident.  3.  Salicylic acid provided for the patient to begin applying daily with a Band-Aid.  Applied today 4.  Prescription for Lamisil 2 and 50 mg #30 daily to see if this helps alleviate any of her chronic diffuse hyperkeratosis of the skin/chronic tinea pedis 5.  Return to clinic in 1 month  Edrick Kins, DPM [facility]  Dr. Edrick Kins, [address]                                        [place], [place] [address]                Office [phone]  Fax [phone]

## 2021-01-15 ENCOUNTER — Other Ambulatory Visit: Payer: Self-pay | Admitting: Podiatry

## 2021-02-16 ENCOUNTER — Ambulatory Visit: Payer: Medicaid Other | Admitting: Podiatry

## 2021-05-09 ENCOUNTER — Emergency Department (HOSPITAL_COMMUNITY)
Admission: EM | Admit: 2021-05-09 | Discharge: 2021-05-10 | Disposition: A | Discharge status: Home / Self Care | Payer: Medicaid Other | Attending: Emergency Medicine | Admitting: Emergency Medicine

## 2021-05-09 ENCOUNTER — Other Ambulatory Visit: Payer: Self-pay

## 2021-05-09 ENCOUNTER — Encounter (HOSPITAL_COMMUNITY): Payer: Self-pay

## 2021-05-09 DIAGNOSIS — N6452 Nipple discharge: Secondary | ICD-10-CM | POA: Insufficient documentation

## 2021-05-09 DIAGNOSIS — N644 Mastodynia: Secondary | ICD-10-CM | POA: Insufficient documentation

## 2021-05-09 DIAGNOSIS — Z79899 Other long term (current) drug therapy: Secondary | ICD-10-CM | POA: Insufficient documentation

## 2021-05-09 HISTORY — DX: Essential (primary) hypertension: I10

## 2021-05-09 MED ORDER — AMOXICILLIN-POT CLAVULANATE 875-125 MG PO TABS: 1.0000 | ORAL_TABLET | Freq: Two times a day (BID) | ORAL | 0 refills | Status: DC

## 2021-05-09 MED ORDER — AMOXICILLIN-POT CLAVULANATE 875-125 MG PO TABS
1.0000 | ORAL_TABLET | Freq: Once | ORAL | Status: DC
  Filled 2021-05-09: qty 1

## 2021-05-09 NOTE — Discharge Instructions (Addendum)
Take the prescribed medication as directed.  Make sure to finish all of the medication.  Can do warm compresses a few times a day as well to see if this helps. Follow-up with your primary care doctor. Return to the ED for new or worsening symptoms.

## 2021-05-09 NOTE — ED Triage Notes (Signed)
A week ago patient had right nipple pain. Today noticed discharge. Irritation on the right nipple, yellow discharge. No piercing. Mammogram a week ago was normal.

## 2021-05-09 NOTE — ED Provider Notes (Signed)
[facility] DEPT Provider Note   CSN: [id number] Arrival date & time: 05/09/21  2328     History  Chief Complaint  Patient presents with   Breast Discharge    Nicole Mullen is a 46 y.o. female.  The history is provided by the patient and medical records.   46 year old female presenting to the ED with right nipple pain/discharge.  States she has been having issues over the past week, just had a mammogram this past week that was normal per her report.  She states since then skin on nipple has cracked some and has started bleeding.  Area is very tender to the touch and has started to notice some redness around the nipple.  Denies fever/chills.    Home Medications Prior to Admission medications   Medication Sig Start Date End Date Taking? Authorizing Provider  clotrimazole-betamethasone (LOTRISONE) cream Apply 1 application topically 2 (two) times daily. 01/20/21   Edrick Kins, DPM  diclofenac (CATAFLAM) 50 MG tablet Take 1 tablet (50 mg total) by mouth 3 (three) times daily. One tablet TID with food prn pain. 11/18/15   Janne Napoleon, NP  furosemide (LASIX) 20 MG tablet Take 1 tablet (20 mg total) by mouth daily. 11/13/14   Everlene Balls, MD  gentamicin cream (GARAMYCIN) 0.1 % Apply 1 application topically 2 (two) times daily. 04/15/20   Edrick Kins, DPM  hydrochlorothiazide (HYDRODIURIL) 12.5 MG tablet Take 12.5 mg by mouth every morning. 12/18/18   [provider]  HYDROcodone-acetaminophen (NORCO/VICODIN) 5-325 MG tablet Take 1 tablet by mouth every 6 (six) hours as needed for moderate pain. 01/31/19   Edrick Kins, DPM  ibuprofen (ADVIL,MOTRIN) 200 MG tablet Take 400 mg by mouth every 6 (six) hours as needed for mild pain.    [provider]  metFORMIN (GLUCOPHAGE) 500 MG tablet Take 500 mg by mouth 2 (two) times daily. 12/18/18   [provider]  methocarbamol (ROBAXIN) 500 MG tablet Take 1 tablet (500 mg total) by mouth 2  (two) times daily. 11/18/15   Janne Napoleon, NP  naproxen (NAPROSYN) 500 MG tablet Take 500 mg by mouth 2 (two) times daily with a meal. 11/29/18   [provider]  terbinafine (LAMISIL) 250 MG tablet Take 1 tablet (250 mg total) by mouth daily. 01/12/21   Edrick Kins, DPM  traMADol (ULTRAM) 50 MG tablet Take 1 tablet (50 mg total) by mouth every 6 (six) hours as needed. 11/18/15   Janne Napoleon, NP  traZODone (DESYREL) 50 MG tablet Take 25 mg by mouth at bedtime. Take 1/2 to 1 tablet    [provider]      Allergies    Other    Review of Systems   Review of Systems  Skin:  Positive for color change.  All other systems reviewed and are negative.  Physical Exam Updated Vital Signs BP (!) 141/112 (BP Location: Left Arm)    Pulse (!) 107    Temp 99.1 F (37.3 C) (Oral)    Resp 19    Ht 5\' 6"  (1.676 m)    Wt (!) 142.9 kg    LMP 12/16/2012 (LMP Unknown)    SpO2 97%    BMI 50.84 kg/m   Physical Exam Vitals and nursing note reviewed.  Constitutional:      Appearance: She is well-developed.  HENT:     Head: Normocephalic and atraumatic.  Eyes:     Conjunctiva/sclera: Conjunctivae normal.     Pupils: Pupils  are equal, round, and reactive to light.  Cardiovascular:     Rate and Rhythm: Normal rate and regular rhythm.     Heart sounds: Normal heart sounds.  Pulmonary:     Effort: Pulmonary effort is normal.     Breath sounds: Normal breath sounds.  Abdominal:     General: Bowel sounds are normal.     Palpations: Abdomen is soft.  Genitourinary:    Comments: Right breast with cracked nipple along superior aspect, no active bleeding, there is minimal erythema along the lateral edge of the areola, there is no noted indurated or palpable abscess formation, no expressed pus during exam Musculoskeletal:        General: Normal range of motion.     Cervical back: Normal range of motion.  Skin:    General: Skin is warm and dry.  Neurological:     Mental Status: She is alert  and oriented to person, place, and time.    ED Results / Procedures / Treatments   Labs (all labs ordered are listed, but only abnormal results are displayed) Labs Reviewed - No data to display  EKG None  Radiology No results found.  Procedures Procedures    Medications Ordered in ED Medications  amoxicillin-clavulanate (AUGMENTIN) 875-125 MG per tablet 1 tablet (1 tablet Oral Not Given 05/10/21 0003)  amoxicillin-clavulanate (AUGMENTIN) 875-125 MG per tablet 1 tablet (1 tablet Oral Given 05/10/21 0008)    ED Course/ Medical Decision Making/ A&P                           Medical Decision Making Risk Prescription drug management.   46 y.o. F here with right nipple pain/discharge.  Reports normal mammogram within the past week. Right nipple is cracked along superior aspect, no expressed discharge during exam.  Very mild erythema along lateral margin of the areola without induration or appreciable abscess.  Concern for possible developing infection.  Will start on course of augmentin, advised warm compresses a few times a day.  Will have her follow-up with PCP for re-check.  Return here for new concerns.  Final Clinical Impression(s) / ED Diagnoses Final diagnoses:  Breast discharge    Rx / DC Orders ED Discharge Orders          Ordered    amoxicillin-clavulanate (AUGMENTIN) 875-125 MG tablet  Every 12 hours        05/09/21 2356              Larene Pickett, PA-C 05/10/21 0125    Fatima Blank, MD 05/10/21 [phone]

## 2021-05-10 MED ORDER — AMOXICILLIN-POT CLAVULANATE 875-125 MG PO TABS
1.0000 | ORAL_TABLET | Freq: Once | ORAL | Status: AC
  Administered 2021-05-10: 1 via ORAL
  Filled 2021-05-10: qty 1

## 2021-05-29 ENCOUNTER — Emergency Department (HOSPITAL_COMMUNITY): Payer: Medicaid Other

## 2021-05-29 ENCOUNTER — Emergency Department (HOSPITAL_COMMUNITY)
Admission: EM | Admit: 2021-05-29 | Discharge: 2021-05-29 | Disposition: A | Discharge status: Home / Self Care | Payer: Medicaid Other | Attending: Emergency Medicine | Admitting: Emergency Medicine

## 2021-05-29 DIAGNOSIS — M25511 Pain in right shoulder: Secondary | ICD-10-CM | POA: Insufficient documentation

## 2021-05-29 DIAGNOSIS — I10 Essential (primary) hypertension: Secondary | ICD-10-CM | POA: Insufficient documentation

## 2021-05-29 DIAGNOSIS — Y92009 Unspecified place in unspecified non-institutional (private) residence as the place of occurrence of the external cause: Secondary | ICD-10-CM | POA: Insufficient documentation

## 2021-05-29 DIAGNOSIS — W109XXA Fall (on) (from) unspecified stairs and steps, initial encounter: Secondary | ICD-10-CM | POA: Diagnosis not present

## 2021-05-29 MED ORDER — HYDROCODONE-ACETAMINOPHEN 5-325 MG PO TABS: 1.0000 | ORAL_TABLET | ORAL | 0 refills | Status: AC | PRN

## 2021-05-29 MED ORDER — HYDROMORPHONE HCL 1 MG/ML IJ SOLN
1.0000 mg | Freq: Once | INTRAMUSCULAR | Status: AC
  Administered 2021-05-29: 1 mg via INTRAVENOUS
  Filled 2021-05-29: qty 1

## 2021-05-29 NOTE — ED Triage Notes (Signed)
Ems brings pt in from home for a fall at home. States she fell down 7 steps and reports her knee gave out. Denies LOC. Pt complains of right shoulder pain.

## 2021-05-29 NOTE — Progress Notes (Signed)
Orthopedic Tech Progress Note Patient Details:  Nicole Mullen Mar 19, 1976 [id number]  Ortho Devices Type of Ortho Device: Arm sling Ortho Device/Splint Location: RUE Ortho Device/Splint Interventions: Application   Post Interventions Patient Tolerated: Well  [name] [place] 05/29/2021, 2:53 PM

## 2021-05-29 NOTE — Discharge Instructions (Addendum)
You were seen today for right shoulder pain. X-rays did not show a fracture. There may still be soft tissue injury. The sling you have been placed in is to be worn for comfort. You may take it off for bathing or as needed. I have provided pain medication which is to be taken as prescribed as needed. I recommend follow up with orthopedics. I have provided the information of the on call provider. Call and schedule an appointment. Return to the emergency department if you have life threatening conditions such as shortness of breath, chest pain, or altered level of consciousness

## 2021-05-29 NOTE — ED Provider Notes (Signed)
[facility] DEPT Provider Note   CSN: [id number] Arrival date & time: 05/29/21  1332     History  Chief Complaint  Patient presents with   Lytle Michaels    Nicole Mullen is a 46 y.o. female.  She presents to the emergency department via EMS complaining of right shoulder pain secondary to a fall.  The patient states she was walking down the stairs at her home when her knee began to give out and she fell down approximately 7 steps.  She landed on her right shoulder and complains of right shoulder pain.  She denies hitting her head or loss of consciousness.  Denies back pain.  Denies knee pain. PMH significant for depression, hypertension, and obesity.   HPI     Home Medications Prior to Admission medications   Medication Sig Start Date End Date Taking? Authorizing Provider  HYDROcodone-acetaminophen (NORCO/VICODIN) 5-325 MG tablet Take 1 tablet by mouth every 4 (four) hours as needed for up to 3 days for severe pain. 05/29/21 06/01/21 Yes Cherlynn June B, PA  amoxicillin-clavulanate (AUGMENTIN) 875-125 MG tablet Take 1 tablet by mouth every 12 (twelve) hours. 05/09/21   Larene Pickett, PA-C  clotrimazole-betamethasone (LOTRISONE) cream Apply 1 application topically 2 (two) times daily. Patient taking differently: Apply 1 application topically See admin instructions. Apply to affected area(s) 2 times a day 01/20/21   Edrick Kins, DPM  diclofenac (CATAFLAM) 50 MG tablet Take 1 tablet (50 mg total) by mouth 3 (three) times daily. One tablet TID with food prn pain. Patient not taking: Reported on 05/10/2021 11/18/15   Janne Napoleon, NP  furosemide (LASIX) 20 MG tablet Take 1 tablet (20 mg total) by mouth daily. Patient not taking: Reported on 05/10/2021 11/13/14   Everlene Balls, MD  gentamicin cream (GARAMYCIN) 0.1 % Apply 1 application topically 2 (two) times daily. Patient not taking: Reported on 05/10/2021 04/15/20   Edrick Kins, DPM  methocarbamol (ROBAXIN) 500 MG  tablet Take 1 tablet (500 mg total) by mouth 2 (two) times daily. Patient not taking: Reported on 05/10/2021 11/18/15   Janne Napoleon, NP  terbinafine (LAMISIL) 250 MG tablet Take 1 tablet (250 mg total) by mouth daily. Patient not taking: Reported on 05/10/2021 01/12/21   Edrick Kins, DPM  traMADol (ULTRAM) 50 MG tablet Take 1 tablet (50 mg total) by mouth every 6 (six) hours as needed. Patient not taking: Reported on 05/10/2021 11/18/15   Janne Napoleon, NP      Allergies    Onion and Other    Review of Systems   Review of Systems  Respiratory:  Negative for shortness of breath.   Cardiovascular:  Negative for chest pain.  Gastrointestinal:  Negative for abdominal pain.  Musculoskeletal:  Positive for arthralgias. Negative for back pain, joint swelling and neck pain.  Neurological:        Paresthesias noted along lower right arm   Physical Exam Updated Vital Signs BP (!) 162/103    Pulse 94    Temp 98.2 F (36.8 C)    Resp 16    Ht 5\' 6"  (1.676 m)    Wt (!) 145.2 kg    LMP 12/16/2012 (LMP Unknown)    SpO2 95%    BMI 51.65 kg/m  Physical Exam Constitutional:      Appearance: She is obese.  HENT:     Head: Normocephalic.  Eyes:     Conjunctiva/sclera: Conjunctivae normal.  Cardiovascular:     Rate and Rhythm:  Normal rate and regular rhythm.     Heart sounds: Normal heart sounds.  Pulmonary:     Effort: Pulmonary effort is normal.     Breath sounds: Normal breath sounds.  Musculoskeletal:        General: Tenderness (Tenderness to palpation along anterior, lateral, and posterior right shoulder) present. No deformity.     Cervical back: Normal range of motion.  Neurological:     Mental Status: She is alert.     Motor: No weakness.    ED Results / Procedures / Treatments   Labs (all labs ordered are listed, but only abnormal results are displayed) Labs Reviewed - No data to display  EKG None  Radiology DG Shoulder Right  Result Date: 05/29/2021 CLINICAL DATA:  Fall with  shoulder pain. EXAM: RIGHT SHOULDER - 2+ VIEW COMPARISON:  None. FINDINGS: There is no evidence of fracture or dislocation. There is no evidence of arthropathy or other focal bone abnormality. Soft tissues are unremarkable. IMPRESSION: Negative. Electronically Signed   By: Audie Pinto M.D.   On: 05/29/2021 14:15    Procedures Procedures    Medications Ordered in ED Medications  HYDROmorphone (DILAUDID) injection 1 mg (1 mg Intravenous Given 05/29/21 1356)    ED Course/ Medical Decision Making/ A&P                           Medical Decision Making Amount and/or Complexity of Data Reviewed Radiology: ordered.  Risk Prescription drug management.   The patient was seen today for right shoulder pain. Differential diagnosis includes but is not limited to shoulder dislocation, humerus fracture, clavicle fracture, and others. The patient was administered IV dilaudid for pain which minimally improved her symptoms. No fracture was noted on shoulder x-ray upon my review. I agree with the radiologist findings. I see no reason for hospital admission at this time. I believe that the patient can discharge home and follow up with orthopedics. The patient agrees with this plan. I will have the patient in a sling and provide pain medication at discharge. Return precautions provided.    Final Clinical Impression(s) / ED Diagnoses Final diagnoses:  Acute pain of right shoulder    Rx / DC Orders ED Discharge Orders          Ordered    HYDROcodone-acetaminophen (NORCO/VICODIN) 5-325 MG tablet  Every 4 hours PRN        05/29/21 [place], [place], [place] 05/29/21 1441    Milton Ferguson, MD 05/29/21 1603

## 2021-08-20 ENCOUNTER — Other Ambulatory Visit: Payer: Self-pay | Admitting: Podiatry

## 2021-09-09 ENCOUNTER — Other Ambulatory Visit: Payer: Self-pay | Admitting: Podiatry

## 2022-01-26 ENCOUNTER — Encounter: Payer: Self-pay | Admitting: Podiatry

## 2022-01-26 ENCOUNTER — Ambulatory Visit (INDEPENDENT_AMBULATORY_CARE_PROVIDER_SITE_OTHER): Payer: Medicaid Other | Admitting: Podiatry

## 2022-01-26 DIAGNOSIS — M79675 Pain in left toe(s): Secondary | ICD-10-CM

## 2022-01-26 DIAGNOSIS — B353 Tinea pedis: Secondary | ICD-10-CM | POA: Diagnosis not present

## 2022-01-26 DIAGNOSIS — M79674 Pain in right toe(s): Secondary | ICD-10-CM

## 2022-01-26 DIAGNOSIS — B351 Tinea unguium: Secondary | ICD-10-CM | POA: Diagnosis not present

## 2022-01-26 MED ORDER — CLOTRIMAZOLE-BETAMETHASONE 1-0.05 % EX CREA: 1.0000 | TOPICAL_CREAM | Freq: Two times a day (BID) | CUTANEOUS | 3 refills | Status: DC

## 2022-01-26 NOTE — Progress Notes (Signed)
   Chief Complaint  Patient presents with   Callouses    Patient is here for corn on right foot bottom.    Subjective: 46 y.o. female presenting to the office today for follow-up evaluation of pain and tenderness associated to callus development to the weightbearing surfaces of the bilateral feet and also the right heel.  Patient states that slowly the calluses return.  She is also requesting a nail trim today.  She is unable to trim her nails and they are thickened elongated and symptomatic with shoes.   Past Medical History:  Diagnosis Date   Depression    Hypertension    Past Surgical History:  Procedure Laterality Date   TUBAL LIGATION     Allergies  Allergen Reactions   Onion Shortness Of Breath and Swelling   Other Shortness Of Breath, Swelling and Other (See Comments)    Onions      Objective:  Physical Exam General: Alert and oriented x3 in no acute distress  Dermatology: Hyperkeratotic lesion(s) present on the right plantar heel as well as bilateral fifth metatarsal tubercles plantar. Pain on palpation with a central nucleated core noted. Skin is warm, dry and supple bilateral lower extremities. Negative for open lesions or macerations. There is also diffuse hyperkeratosis along the weightbearing surfaces of the feet Hyperkeratotic elongated dystrophic nails also noted 1-5 bilateral  Vascular: Palpable pedal pulses bilaterally. No edema or erythema noted. Capillary refill within normal limits.  Neurological: Epicritic and protective threshold grossly intact bilaterally.   Musculoskeletal Exam: No deformity noted  Assessment: 1.  Porokeratosis/symptomatic calluses bilateral feet 2.  Suspicion for chronic tinea pedis 3.  Pain due to onychomycosis of toenails both   Plan of Care:  1. Patient evaluated 2. Excisional debridement of keratoic lesion(s) using a chisel blade was performed without incident.  3.  Mechanical debridement of nails 1-5 bilateral was also  performed using a nail nipper without incident or bleeding 4.  Prescription for Lotrisone cream to apply 2 times daily to the feet 5.  OTC urea 40% lotion dispensed at checkout 6.  Return to clinic as needed  Edrick Kins, DPM [facility]  Dr. Edrick Kins, [address]                                        [place], [place] [address]                Office [phone]  Fax [phone]

## 2022-04-21 ENCOUNTER — Other Ambulatory Visit: Payer: Self-pay | Admitting: Podiatry

## 2022-07-07 ENCOUNTER — Other Ambulatory Visit (INDEPENDENT_AMBULATORY_CARE_PROVIDER_SITE_OTHER): Payer: Self-pay | Admitting: Pediatrics

## 2023-09-05 LAB — RESULTS CONSOLE HPV: CHL HPV: POSITIVE

## 2023-09-05 LAB — HM PAP SMEAR

## 2023-09-26 ENCOUNTER — Encounter: Payer: Self-pay | Admitting: Podiatry

## 2023-09-26 ENCOUNTER — Ambulatory Visit (INDEPENDENT_AMBULATORY_CARE_PROVIDER_SITE_OTHER): Admitting: Podiatry

## 2023-09-26 VITALS — Ht 66.0 in | Wt 320.0 lb

## 2023-09-26 DIAGNOSIS — M722 Plantar fascial fibromatosis: Secondary | ICD-10-CM

## 2023-09-26 DIAGNOSIS — D2371 Other benign neoplasm of skin of right lower limb, including hip: Secondary | ICD-10-CM | POA: Diagnosis not present

## 2023-09-26 DIAGNOSIS — M19071 Primary osteoarthritis, right ankle and foot: Secondary | ICD-10-CM | POA: Diagnosis not present

## 2023-09-26 MED ORDER — BETAMETHASONE SOD PHOS & ACET 6 (3-3) MG/ML IJ SUSP
3.0000 mg | Freq: Once | INTRAMUSCULAR | Status: AC
Start: 2023-09-26 — End: 2023-09-26
  Administered 2023-09-26: 3 mg via INTRA_ARTICULAR

## 2023-09-26 NOTE — Progress Notes (Signed)
   Chief Complaint  Patient presents with   Nail Problem    Pt is here for [facility]    Subjective: 48 y.o. female presenting to the office today for follow-up evaluation of pain and tenderness associated to symptomatic skin lesions to the  plantar aspect of the right heel.  She also has chronic ankle pain and most recently left plantar heel pain.  Injections in the past have helped significantly   Past Medical History:  Diagnosis Date   Depression    Hypertension    Past Surgical History:  Procedure Laterality Date   TUBAL LIGATION     Allergies  Allergen Reactions   Onion Shortness Of Breath and Swelling   Other Shortness Of Breath, Swelling and Other (See Comments)    Onions      Objective:  Physical Exam General: Alert and oriented x3 in no acute distress  Dermatology: Hyperkeratotic skin lesion present on the right plantar heel x two.  Pain on palpation with a central nucleated core noted. Skin is warm, dry and supple bilateral lower extremities. Negative for open lesions or macerations.  Vascular: Palpable pedal pulses bilaterally. No edema or erythema noted. Capillary refill within normal limits.  Neurological: Light touch and protective threshold grossly intact bilaterally.   Musculoskeletal Exam: Tenderness with palpation noted to the medial and lateral aspect of the right ankle joint as well as along both plantar fascia and plantar heel left  Assessment: 1.  Arthritis right ankle 2.  Plantar fasciitis left 3.  Eccrine poroma right plantar heel x 2   Plan of Care:  -Patient evaluated -Excisional debridement of the eccrine poroma using a chisel blade was performed without incident.  Salicylic acid applied -Recommend OTC corn and callus remover PRN -Injection of 0.5 cc Celestone  Soluspan injected into the plantar fascia left as well as the right ankle -Recommended supportive tennis shoes and sneakers.  Advised against going barefoot -Return to clinic as  needed  Thresa EMERSON Sar, DPM [facility]  Dr. Thresa EMERSON Sar, DPM    [address]                                        [place], [place] [address]                Office [phone]  Fax [phone]

## 2023-10-17 ENCOUNTER — Other Ambulatory Visit: Payer: Self-pay

## 2023-10-18 ENCOUNTER — Encounter: Payer: Self-pay | Admitting: Obstetrics and Gynecology

## 2023-10-18 ENCOUNTER — Other Ambulatory Visit (HOSPITAL_COMMUNITY)
Admission: RE | Admit: 2023-10-18 | Discharge: 2023-10-18 | Disposition: A | Discharge status: Home / Self Care | Source: Ambulatory Visit | Attending: Obstetrics and Gynecology | Admitting: Obstetrics and Gynecology

## 2023-10-18 ENCOUNTER — Ambulatory Visit: Admitting: Obstetrics and Gynecology

## 2023-10-18 VITALS — BP 107/74 | HR 90 | Ht 67.0 in | Wt 290.0 lb

## 2023-10-18 DIAGNOSIS — N950 Postmenopausal bleeding: Secondary | ICD-10-CM | POA: Insufficient documentation

## 2023-10-18 DIAGNOSIS — Z3202 Encounter for pregnancy test, result negative: Secondary | ICD-10-CM | POA: Diagnosis not present

## 2023-10-18 DIAGNOSIS — R87810 Cervical high risk human papillomavirus (HPV) DNA test positive: Secondary | ICD-10-CM | POA: Insufficient documentation

## 2023-10-18 DIAGNOSIS — Z1331 Encounter for screening for depression: Secondary | ICD-10-CM

## 2023-10-18 DIAGNOSIS — R87610 Atypical squamous cells of undetermined significance on cytologic smear of cervix (ASC-US): Secondary | ICD-10-CM | POA: Insufficient documentation

## 2023-10-18 LAB — POCT URINE PREGNANCY: Preg Test, Ur: NEGATIVE

## 2023-10-18 NOTE — Progress Notes (Signed)
 48 y.o. New GYN presents for COLPO, ASCUS on PAP.  UPT Negative.

## 2023-10-18 NOTE — Progress Notes (Signed)
   NEW GYNECOLOGY VISIT  Subjective:  Nicole Mullen is a 48 y.o. G1P1001 s/p BTL presenting for PMB and abnormal pap  Patient reports her periods completely stopped at age 4. She denies ever getting testing or being told why they had stopped. She had longstanding history of irregular periods. She was not on any hormonal or mental health medications around that time. In the past year, she has started having monthly cramping/spotting or bleeding when wiping around the time her daughter is on her cycle. Last episode of bleeding was last month. Notes facial hair growth.   Pap obtained as part of the workup was ASCUS/HPV+  History is notable for BMI 45, T2DM, HTN, and tobacco use  I personally reviewed the following: - Pap 09/05/23 ASCUS/HPV+ - Pelvic US  09/19/23 uterus 6.9 x 4.4 x 4.4cm with EL 7.73mm, normal ovaries x 2 - [facility] 09/05/23 48.00 - Estradiol 09/05/23 48.0 - TSH 09/05/23 1.098  Objective:   Vitals:   10/18/23 0831  BP: 107/74  Pulse: 90  Weight: 290 lb (131.5 kg)  Height: 5' 7 (1.702 m)   General:  Alert, oriented and cooperative. Patient is in no acute distress.  Skin: Skin is warm and dry. No rash noted.   Cardiovascular: Normal heart rate noted  Respiratory: Normal respiratory effort, no problems with respiration noted  See procedure note for exam findings  Assessment and Plan:  Nicole Mullen is a 48 y.o. with PMB and abnormal pap  1. Postmenopausal bleeding Given history, concern for POI and new PMB. Reviewed with patient she has several risk factors for hyperplasia/malignancy including BMI, HTN, DM as well as EL >57mm on US . Ddx also includes atrophy or polyp. Recommended EMB which she accepts. Uncomplicated EMB performed.  - Surgical pathology( [place]/ [facility])  2. ASCUS with positive high risk HPV cervical Uncomplicated colpo done today - POCT urine pregnancy - Surgical pathology( [place]/ [facility])  Return for after biopsy results.  Future  Appointments  Date Time Provider Department Center  05/08/2024 11:00 AM [place], Erminio POUR, PA-C [facility] None   Kieth JAYSON Carolin, MD

## 2023-10-18 NOTE — Progress Notes (Signed)
    GYNECOLOGY OFFICE COLPOSCOPY/ENDOMETRIAL BIOPSY PROCEDURE NOTE  48 y.o. G1P1001 here for colposcopy for ASCUS with POSITIVE high risk HPV pap smear on 09/05/23 and for ?postmenopausal bleeding. Pelvic US  09/19/23 with normal uterus and ovaries, EL 7.2mm  Pregnancy test:  negative Gardasil: n/a  Tobacco: 1/2ppd since age 21  Informed consent and review of risks, benefit and alternatives performed. The indications for endometrial biopsy were reviewed.   Risks of the biopsy including cramping, bleeding, infection, uterine perforation, inadequate specimen and need for additional procedures were discussed. Written consent given.   Speculum inserted into patient's vagina assuring full view of cervix and vaginal walls. 3 swabs of vinegar solution applied to the cervix and vaginal walls and colposcope was used to observe both the cervix and vaginal walls.   Colposcopy adequate? Yes Thin AWE from 5-7 o'clock at 10-11 o'clock within cervical canal; corresponding biopsies obtained.  ECC collected. Predict CINI. Reasonable in office LEEP candidate  The cervix was then prepped with betadine. The endometrial biopsy pipelle was easily inserted into the uterus and a moderate amount of tissue was obtained after two passes.   All specimens were labeled and sent to pathology.  All instruments were removed.  Pt tolerated well with some cramping and minimal bleeding.   Patient was given post procedure instructions.  Will follow up pathology and manage accordingly; patient will be contacted with results and recommendations.  Routine preventative health maintenance measures emphasized.  Kieth Carolin, MD Obstetrician & Gynecologist, [facility] for [employer], [facility]

## 2023-10-18 NOTE — Patient Instructions (Signed)
 It is normal to have cramping and bleeding for the next 2-3 days. You should feel better every day.   Please call us  if you have any severe pain, bleeding that soaks more than 1 pad in a hour, have fevers, or feel like you're going to pass out.   You can take tylenol  and ibuprofen  as needed for pain. It is ok to take both at the same time.

## 2023-10-19 ENCOUNTER — Ambulatory Visit: Payer: Self-pay | Admitting: Obstetrics and Gynecology

## 2023-10-19 DIAGNOSIS — N870 Mild cervical dysplasia: Secondary | ICD-10-CM | POA: Insufficient documentation

## 2023-10-19 DIAGNOSIS — N950 Postmenopausal bleeding: Secondary | ICD-10-CM | POA: Insufficient documentation

## 2023-10-19 LAB — SURGICAL PATHOLOGY

## 2023-10-28 ENCOUNTER — Encounter (HOSPITAL_COMMUNITY): Payer: Self-pay | Admitting: *Deleted

## 2023-10-28 ENCOUNTER — Ambulatory Visit (HOSPITAL_COMMUNITY)
Admission: EM | Admit: 2023-10-28 | Discharge: 2023-10-28 | Disposition: A | Discharge status: Home / Self Care | Attending: Internal Medicine | Admitting: Internal Medicine

## 2023-10-28 ENCOUNTER — Ambulatory Visit (INDEPENDENT_AMBULATORY_CARE_PROVIDER_SITE_OTHER)

## 2023-10-28 DIAGNOSIS — M25512 Pain in left shoulder: Secondary | ICD-10-CM

## 2023-10-28 HISTORY — DX: Type 2 diabetes mellitus without complications: E11.9

## 2023-10-28 MED ORDER — DEXAMETHASONE SODIUM PHOSPHATE 10 MG/ML IJ SOLN
INTRAMUSCULAR | Status: AC
  Filled 2023-10-28: qty 1

## 2023-10-28 MED ORDER — KETOROLAC TROMETHAMINE 30 MG/ML IJ SOLN
30.0000 mg | Freq: Once | INTRAMUSCULAR | Status: AC
  Administered 2023-10-28: 30 mg via INTRAMUSCULAR

## 2023-10-28 MED ORDER — KETOROLAC TROMETHAMINE 30 MG/ML IJ SOLN
INTRAMUSCULAR | Status: AC
  Filled 2023-10-28: qty 1

## 2023-10-28 MED ORDER — DEXAMETHASONE SODIUM PHOSPHATE 10 MG/ML IJ SOLN
10.0000 mg | Freq: Once | INTRAMUSCULAR | Status: AC
  Administered 2023-10-28: 10 mg via INTRAMUSCULAR

## 2023-10-28 MED ORDER — PREDNISONE 20 MG PO TABS: 40.0000 mg | ORAL_TABLET | Freq: Every day | ORAL | 0 refills | Status: AC

## 2023-10-28 MED ORDER — CYCLOBENZAPRINE HCL 5 MG PO TABS: 5.0000 mg | ORAL_TABLET | Freq: Three times a day (TID) | ORAL | 0 refills | Status: DC | PRN

## 2023-10-28 NOTE — Discharge Instructions (Addendum)
 X-ray done of the left shoulder and left clavicle today.  There are no acute abnormalities.  Likely symptoms are secondary to inflammation in the left shoulder joint.  We will treat with the following and if there is no improvement then will need to follow-up with orthopedics: Toradol  injection given today. This is a medication to help with pain. This is not a narcotic.  Decadron injection given today. This is a steroid to help with inflammation and pain. Start 10/29/23: Prednisone 40 mg (2 tablets) once daily for 4 days. Take this in the morning.  This is a steroid to help with inflammation and pain. Monitor your blood sugars closely while on the steroids as they can cause a slight increase.  Flexeril 5 mg every 8 hours as needed for muscle spasms.  Use caution as this medication can cause drowsiness.  May use a sling to help with supporting the arm for the next 2-3 days.  Light stretching to help mobility Ice the area 2-3 times daily for 10-15 minutes to help with pain and swelling. Do not apply ice directly to the skin.  If symptoms fail to improve in the next 5 to 7 days then recommend following up with orthopedics Can return to urgent care as needed

## 2023-10-28 NOTE — ED Triage Notes (Signed)
 Pt states she has pain in her left shoulder X 2 days but doesn't recall any injury. She states she woke up and was unable to lift her left arm. Her left hand is starting to swell. Pt states she took 1600mg  IBU this yesterday at once, which didn't help the pain.

## 2023-10-28 NOTE — ED Provider Notes (Signed)
 [facility]    CSN: [id number] Arrival date & time: 10/28/23  1230      History   Chief Complaint Chief Complaint  Patient presents with   Shoulder Pain    HPI Nicole Mullen is a 48 y.o. female.   48 year old female who presents to urgent care with complaints of severe left shoulder pain and decreased range of motion.  This started about 2 days ago.  She woke up with her shoulder very painful and unable to lift it.  She has not been moving her arm much due to this.  She has noted some swelling in her hand due to this.  She is left-handed.  She denies any injury to the area that she knows of.  She has tried to take over-the-counter medication but it has not helped.   Shoulder Pain Associated symptoms: no back pain and no fever     Past Medical History:  Diagnosis Date   Arthritis    Asthma    Depression    Diabetes mellitus without complication (HCC)    Hypertension    Vaginal Pap smear, abnormal     Patient Active Problem List   Diagnosis Date Noted   Dysplasia of cervix, low grade (CIN 1) 10/19/2023   Postmenopausal bleeding 10/19/2023    Past Surgical History:  Procedure Laterality Date   TUBAL LIGATION      OB History     Gravida  1   Para  1   Term  1   Preterm      AB      Living  1      SAB      IAB      Ectopic      Multiple      Live Births  1            Home Medications    Prior to Admission medications   Medication Sig Start Date End Date Taking? Authorizing Provider  atorvastatin (LIPITOR) 40 MG tablet Take 40 mg by mouth at bedtime. 10/04/23  Yes [provider]  cyclobenzaprine (FLEXERIL) 5 MG tablet Take 1 tablet (5 mg total) by mouth every 8 (eight) hours as needed for muscle spasms. 10/28/23  Yes Amad Mau A, PA-C  FARXIGA 10 MG TABS tablet Take 10 mg by mouth daily. 10/04/23  Yes [provider]  ibuprofen  (ADVIL ) 200 MG tablet Take 400 mg by mouth. 12/01/20  Yes [provider]  metFORMIN (GLUCOPHAGE) 500 MG tablet Take 500 mg by mouth 2 (two) times daily. 10/04/23  Yes [provider]  predniSONE (DELTASONE) 20 MG tablet Take 2 tablets (40 mg total) by mouth daily with breakfast for 4 days. 10/28/23 11/01/23 Yes Damari Hiltz A, PA-C  TRULICITY 3 MG/0.5ML SOAJ SMARTSIG:0.5 Milliliter(s) SUB-Q Once a Week 09/30/23  Yes [provider]  valsartan-hydrochlorothiazide (DIOVAN-HCT) 160-12.5 MG tablet Take 1 tablet by mouth daily. 10/04/23  Yes [provider]  Vitamin D, Ergocalciferol, (DRISDOL) 1.25 MG (50000 UNIT) CAPS capsule Take 50,000 Units by mouth once a week. 10/04/23  Yes [provider]  ACCU-CHEK GUIDE TEST test strip 1 each daily. 07/29/23   [provider]  Accu-Chek Softclix Lancets lancets 1 each daily. 05/30/23   [provider]  amoxicillin -clavulanate (AUGMENTIN ) 875-125 MG tablet Take 1 tablet by mouth every 12 (twelve) hours. 05/09/21   Jarold Olam HERO, PA-C  Blood Glucose Monitoring Suppl (ACCU-CHEK GUIDE ME) w/Device KIT daily. 05/30/23   [provider]  clotrimazole -betamethasone  (LOTRISONE ) cream Apply 1 Application topically 2 (two) times daily. 04/21/22   Janit Thresa HERO, DPM  diclofenac  (CATAFLAM ) 50 MG tablet Take 1 tablet (50 mg total) by mouth 3 (three) times daily. One tablet TID with food prn pain. Patient not taking: Reported on 05/10/2021 11/18/15   Tharon Lenis, NP  furosemide  (LASIX ) 20 MG tablet Take 1 tablet (20 mg total) by mouth daily. Patient not taking: Reported on 05/10/2021 11/13/14   Carlota Day, MD  gentamicin  cream (GARAMYCIN ) 0.1 % Apply 1 application topically 2 (two) times daily. Patient not taking: Reported on 05/10/2021 04/15/20   Janit Thresa HERO, DPM  LINZESS 290 MCG CAPS capsule SMARTSIG:1 Capsule(s) By Mouth PRN 09/30/23   [provider]  methocarbamol  (ROBAXIN ) 500 MG tablet Take 1 tablet (500 mg total) by mouth 2 (two) times daily. Patient not taking: Reported on  05/10/2021 11/18/15   Tharon Lenis, NP  terbinafine  (LAMISIL ) 250 MG tablet Take 1 tablet (250 mg total) by mouth daily. Patient not taking: Reported on 05/10/2021 01/12/21   Janit Thresa HERO, DPM  traMADol  (ULTRAM ) 50 MG tablet Take 1 tablet (50 mg total) by mouth every 6 (six) hours as needed. Patient not taking: Reported on 05/10/2021 11/18/15   Tharon Lenis, NP    Family History Family History  Problem Relation Age of Onset   Cancer Paternal Aunt    Breast cancer Neg Hx     Social History Social History   Tobacco Use   Smoking status: Every Day    Types: Cigarettes   Smokeless tobacco: Never  Vaping Use   Vaping status: Never Used  Substance Use Topics   Alcohol use: No   Drug use: No     Allergies   Onion and Other   Review of Systems Review of Systems  Constitutional:  Negative for chills and fever.  HENT:  Negative for ear pain and sore throat.   Eyes:  Negative for pain and visual disturbance.  Respiratory:  Negative for cough and shortness of breath.   Cardiovascular:  Negative for chest pain and palpitations.  Gastrointestinal:  Negative for abdominal pain and vomiting.  Genitourinary:  Negative for dysuria and hematuria.  Musculoskeletal:  Negative for arthralgias and back pain.       Left shoulder pain and decreased range of motion  Skin:  Negative for color change and rash.  Neurological:  Negative for seizures and syncope.  All other systems reviewed and are negative.    Physical Exam Triage Vital Signs ED Triage Vitals  Encounter Vitals Group     BP 10/28/23 1329 114/79     Girls Systolic BP Percentile --      Girls Diastolic BP Percentile --      Boys Systolic BP Percentile --      Boys Diastolic BP Percentile --      Pulse Rate 10/28/23 1329 96     Resp 10/28/23 1329 18     Temp 10/28/23 1329 98.8 F (37.1 C)     Temp Source 10/28/23 1329 Oral     SpO2 10/28/23 1329 94 %     Weight --      Height --      Head Circumference --      Peak Flow --       Pain Score 10/28/23 1327 10     Pain Loc --      Pain Education --      Exclude from Growth Chart --  No data found.  Updated Vital Signs BP 114/79 (BP Location: Right Arm)   Pulse 96   Temp 98.8 F (37.1 C) (Oral)   Resp 18   LMP 12/16/2012 (LMP Unknown)   SpO2 94%   Visual Acuity Right Eye Distance:   Left Eye Distance:   Bilateral Distance:    Right Eye Near:   Left Eye Near:    Bilateral Near:     Physical Exam Vitals and nursing note reviewed.  Constitutional:      General: She is not in acute distress.    Appearance: She is well-developed.  HENT:     Head: Normocephalic and atraumatic.  Eyes:     Conjunctiva/sclera: Conjunctivae normal.  Cardiovascular:     Rate and Rhythm: Normal rate and regular rhythm.     Heart sounds: No murmur heard. Pulmonary:     Effort: Pulmonary effort is normal. No respiratory distress.     Breath sounds: Normal breath sounds.  Abdominal:     Palpations: Abdomen is soft.     Tenderness: There is no abdominal tenderness.  Musculoskeletal:        General: No swelling.     Left shoulder: Tenderness and bony tenderness (along clavicle) present. No swelling or deformity. Decreased range of motion. Normal strength. Normal pulse.     Left hand: Swelling present. Normal range of motion. Normal sensation. Normal capillary refill. Normal pulse.     Cervical back: Neck supple.  Skin:    General: Skin is warm and dry.     Capillary Refill: Capillary refill takes less than 2 seconds.  Neurological:     Mental Status: She is alert.  Psychiatric:        Mood and Affect: Mood normal.      UC Treatments / Results  Labs (all labs ordered are listed, but only abnormal results are displayed) Labs Reviewed - No data to display  EKG   Radiology DG Shoulder Left Result Date: 10/28/2023 CLINICAL DATA:  left shoulder pain, unable to abduct arm. No known injury. EXAM: LEFT SHOULDER - 2+ VIEW; LEFT CLAVICLE - 2+ VIEWS COMPARISON:   None Available. FINDINGS: No acute fracture or dislocation. No aggressive osseous lesion. Glenohumeral and acromioclavicular joints are normal in alignment. No significant degenerative changes. No soft tissue swelling. No radiopaque foreign bodies. IMPRESSION: *No acute osseous abnormality of the left shoulder and clavicle. Electronically Signed   By: Ree Molt M.D.   On: 10/28/2023 14:06   DG Clavicle Left Result Date: 10/28/2023 CLINICAL DATA:  left shoulder pain, unable to abduct arm. No known injury. EXAM: LEFT SHOULDER - 2+ VIEW; LEFT CLAVICLE - 2+ VIEWS COMPARISON:  None Available. FINDINGS: No acute fracture or dislocation. No aggressive osseous lesion. Glenohumeral and acromioclavicular joints are normal in alignment. No significant degenerative changes. No soft tissue swelling. No radiopaque foreign bodies. IMPRESSION: *No acute osseous abnormality of the left shoulder and clavicle. Electronically Signed   By: Ree Molt M.D.   On: 10/28/2023 14:06    Procedures Procedures (including critical care time)  Medications Ordered in UC Medications  ketorolac  (TORADOL ) 30 MG/ML injection 30 mg (30 mg Intramuscular Given 10/28/23 1414)  dexamethasone (DECADRON) injection 10 mg (10 mg Intramuscular Given 10/28/23 1413)    Initial Impression / Assessment and Plan / UC Course  I have reviewed the triage vital signs and the nursing notes.  Pertinent labs & imaging results that were available during my care of the patient were reviewed by me and  considered in my medical decision making (see chart for details).     Acute pain of left shoulder - Plan: DG Shoulder Left, DG Clavicle Left, DG Shoulder Left, DG Clavicle Left   X-ray done of the left shoulder and left clavicle today.  There are no acute abnormalities.  Likely symptoms are secondary to inflammation in the left shoulder joint.  We will treat with the following and if there is no improvement then will need to follow-up with  orthopedics: Toradol  injection given today. This is a medication to help with pain. This is not a narcotic.  Decadron injection given today. This is a steroid to help with inflammation and pain. Start 10/29/23: Prednisone 40 mg (2 tablets) once daily for 4 days. Take this in the morning.  This is a steroid to help with inflammation and pain. Monitor your blood sugars closely while on the steroids as they can cause a slight increase.  Flexeril 5 mg every 8 hours as needed for muscle spasms.  Use caution as this medication can cause drowsiness.  May use a sling to help with supporting the arm for the next 2-3 days.  Light stretching to help mobility Ice the area 2-3 times daily for 10-15 minutes to help with pain and swelling. Do not apply ice directly to the skin.  If symptoms fail to improve in the next 5 to 7 days then recommend following up with orthopedics Can return to urgent care as needed  Final Clinical Impressions(s) / UC Diagnoses   Final diagnoses:  Acute pain of left shoulder     Discharge Instructions      X-ray done of the left shoulder and left clavicle today.  There are no acute abnormalities.  Likely symptoms are secondary to inflammation in the left shoulder joint.  We will treat with the following and if there is no improvement then will need to follow-up with orthopedics: Toradol  injection given today. This is a medication to help with pain. This is not a narcotic.  Decadron injection given today. This is a steroid to help with inflammation and pain. Start 10/29/23: Prednisone 40 mg (2 tablets) once daily for 4 days. Take this in the morning.  This is a steroid to help with inflammation and pain. Monitor your blood sugars closely while on the steroids as they can cause a slight increase.  Flexeril 5 mg every 8 hours as needed for muscle spasms.  Use caution as this medication can cause drowsiness.  May use a sling to help with supporting the arm for the next 2-3 days.   Light stretching to help mobility Ice the area 2-3 times daily for 10-15 minutes to help with pain and swelling. Do not apply ice directly to the skin.  If symptoms fail to improve in the next 5 to 7 days then recommend following up with orthopedics Can return to urgent care as needed     ED Prescriptions     Medication Sig Dispense Auth. Provider   predniSONE (DELTASONE) 20 MG tablet Take 2 tablets (40 mg total) by mouth daily with breakfast for 4 days. 8 tablet Shlok Raz A, PA-C   cyclobenzaprine (FLEXERIL) 5 MG tablet Take 1 tablet (5 mg total) by mouth every 8 (eight) hours as needed for muscle spasms. 30 tablet Teresa Almarie LABOR, [place]      PDMP not reviewed this encounter.   Teresa Almarie LABOR, PA-C 10/28/23 1427

## 2024-03-21 ENCOUNTER — Encounter (HOSPITAL_BASED_OUTPATIENT_CLINIC_OR_DEPARTMENT_OTHER): Payer: Self-pay | Admitting: Internal Medicine

## 2024-03-21 DIAGNOSIS — R0683 Snoring: Secondary | ICD-10-CM

## 2024-03-21 DIAGNOSIS — R5383 Other fatigue: Secondary | ICD-10-CM

## 2024-05-08 ENCOUNTER — Ambulatory Visit: Admitting: Physician Assistant

## 2024-05-08 ENCOUNTER — Encounter: Payer: Self-pay | Admitting: Physician Assistant

## 2024-05-08 VITALS — BP 114/80

## 2024-05-08 DIAGNOSIS — B354 Tinea corporis: Secondary | ICD-10-CM | POA: Diagnosis not present

## 2024-05-08 NOTE — Patient Instructions (Signed)

## 2024-05-08 NOTE — Progress Notes (Signed)
" ° °  New Patient Visit   Subjective  Nicole Mullen is a 49 y.o. female NEW PATIENT who presents for the following: Rash of groin area. It has always been there but it does not bother her unless she is hot or sweating.    The following portions of the chart were reviewed this encounter and updated as appropriate: medications, allergies, medical history  Review of Systems:  No other skin or systemic complaints except as noted in HPI or Assessment and Plan.  Objective  Well appearing patient in no apparent distress; mood and affect are within normal limits.   A focused examination was performed of the following areas: umbilicus, groin and thighs    Relevant exam findings are noted in the Assessment and Plan.    Assessment & Plan   TINEA CORPORIS Exam: large hyperpigmented scaly patches   Treatment Plan: Recommend OTC Lamisil  cream daily until clear.   TINEA CORPORIS    Return if symptoms worsen or fail to improve.  I, Roseline Hutchinson, CMA, am acting as scribe for Shakim Faith K, PA-C .   Documentation: I have reviewed the above documentation for accuracy and completeness, and I agree with the above.  Nazair Fortenberry K, PA-C    "
